# Patient Record
Sex: Female | Born: 1995 | Race: White | Hispanic: No | Marital: Single | Smoking: Former smoker
Health system: Southern US, Community
[De-identification: ages and names within clinical notes are randomized; demographics above are authoritative.]

## PROBLEM LIST (undated history)

## (undated) DIAGNOSIS — Z789 Other specified health status: Secondary | ICD-10-CM

## (undated) HISTORY — PX: ADENOIDECTOMY: SUR15

## (undated) HISTORY — PX: TONSILLECTOMY: SUR1361

## (undated) HISTORY — DX: Other specified health status: Z78.9

---
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
## 2014-07-22 ENCOUNTER — Encounter (HOSPITAL_COMMUNITY): Payer: Self-pay | Admitting: Emergency Medicine

## 2014-07-22 ENCOUNTER — Emergency Department (HOSPITAL_COMMUNITY)
Admission: EM | Admit: 2014-07-22 | Discharge: 2014-07-22 | Disposition: A | Discharge status: Home / Self Care | Payer: BC Managed Care – PPO | Attending: Emergency Medicine | Admitting: Emergency Medicine

## 2014-07-22 DIAGNOSIS — O9989 Other specified diseases and conditions complicating pregnancy, childbirth and the puerperium: Secondary | ICD-10-CM | POA: Insufficient documentation

## 2014-07-22 DIAGNOSIS — IMO0002 Reserved for concepts with insufficient information to code with codable children: Secondary | ICD-10-CM | POA: Insufficient documentation

## 2014-07-22 DIAGNOSIS — O99330 Smoking (tobacco) complicating pregnancy, unspecified trimester: Secondary | ICD-10-CM | POA: Insufficient documentation

## 2014-07-22 DIAGNOSIS — T148XXA Other injury of unspecified body region, initial encounter: Secondary | ICD-10-CM

## 2014-07-22 DIAGNOSIS — O209 Hemorrhage in early pregnancy, unspecified: Secondary | ICD-10-CM | POA: Insufficient documentation

## 2014-07-22 DIAGNOSIS — R296 Repeated falls: Secondary | ICD-10-CM | POA: Insufficient documentation

## 2014-07-22 DIAGNOSIS — N939 Abnormal uterine and vaginal bleeding, unspecified: Secondary | ICD-10-CM

## 2014-07-22 DIAGNOSIS — Y9389 Activity, other specified: Secondary | ICD-10-CM | POA: Insufficient documentation

## 2014-07-22 DIAGNOSIS — Y9289 Other specified places as the place of occurrence of the external cause: Secondary | ICD-10-CM | POA: Insufficient documentation

## 2014-07-22 DIAGNOSIS — R1032 Left lower quadrant pain: Secondary | ICD-10-CM | POA: Insufficient documentation

## 2014-07-22 MED ORDER — BACITRACIN ZINC 500 UNIT/GM EX OINT
1.0000 "application " | TOPICAL_OINTMENT | Freq: Two times a day (BID) | CUTANEOUS | Status: DC
  Administered 2014-07-22: 1 via TOPICAL

## 2014-07-22 MED ORDER — ACETAMINOPHEN 500 MG PO TABS
1000.0000 mg | ORAL_TABLET | Freq: Once | ORAL | Status: AC
Start: 2014-07-22 — End: 2014-07-22
  Administered 2014-07-22: 1000 mg via ORAL
  Filled 2014-07-22: qty 2

## 2014-07-22 NOTE — Discharge Instructions (Signed)
Tylenol for pain antibioitc ointment on your wounds twice daily See your OBGYN at your appointment this week.

## 2014-07-22 NOTE — ED Provider Notes (Signed)
CSN: [id number]     Arrival date & time 07/22/14  2007 History   First MD Initiated Contact with Patient 07/22/14 2206     Chief Complaint  Patient presents with  . Fall     (Consider location/radiation/quality/duration/timing/severity/associated sxs/prior Treatment) HPI Comments: 18 year old female pregnant at approximately 4 weeks by dates, positive pregnancy test at home x4. She presents after having a fall onto her hands and knees, received abrasions of all 4 extremities at the palms and knees. Also went on her abdomen and had a small amount of spotting afterwards. She denies any vaginal discharge, dysuria, fever chills nausea vomiting. She does have mild left lower quadrant abdominal discomfort after the fall. She is G1 P0, has appointment with OB/GYN this week.  Patient is a 18 y.o. female presenting with fall. The history is provided by the patient.  Fall    History reviewed. No pertinent past medical history. Past Surgical History  Procedure Laterality Date  . Tonsillectomy    . Adenoidectomy     Family History  Problem Relation Age of Onset  . Diabetes Father    History  Substance Use Topics  . Smoking status: Current Every Day Smoker  . Smokeless tobacco: Not on file  . Alcohol Use: No   OB History   Grav Para Term Preterm Abortions TAB SAB Ect Mult Living   1              Review of Systems  All other systems reviewed and are negative.     Allergies  Review of patient's allergies indicates no known allergies.  Home Medications   Prior to Admission medications   Not on File   BP 139/85  Pulse 90  Temp(Src) 98.4 F (36.9 C) (Oral)  Resp 16  SpO2 100%  LMP 05/26/2014 Physical Exam  Nursing note and vitals reviewed. Constitutional: She appears well-developed and well-nourished. No distress.  HENT:  Head: Normocephalic and atraumatic.  Mouth/Throat: Oropharynx is clear and moist. No oropharyngeal exudate.  Eyes: Conjunctivae and EOM are normal.  Pupils are equal, round, and reactive to light. Right eye exhibits no discharge. Left eye exhibits no discharge. No scleral icterus.  Neck: Normal range of motion. Neck supple. No JVD present. No thyromegaly present.  Cardiovascular: Normal rate, regular rhythm, normal heart sounds and intact distal pulses.  Exam reveals no gallop and no friction rub.   No murmur heard. Pulmonary/Chest: Effort normal and breath sounds normal. No respiratory distress. She has no wheezes. She has no rales.  Abdominal: Soft. Bowel sounds are normal. She exhibits no distension and no mass. There is tenderness (minimal left lower quadrant tenderness, no rebound guarding or masses).  Musculoskeletal: Normal range of motion. She exhibits no edema and no tenderness.  Lymphadenopathy:    She has no cervical adenopathy.  Neurological: She is alert. Coordination normal.  Skin: Skin is warm and dry. No rash noted. No erythema.  Psychiatric: She has a normal mood and affect. Her behavior is normal.    ED Course  Procedures (including critical care time) Labs Review Labs Reviewed - No data to display  Imaging Review No results found.    MDM   Final diagnoses:  None     Pelvic exam deferred given that the patient has a very early pregnancy which had seen on ultrasound with a small amount of fluid in the uterus. No definite fetus was seen, the patient can followup in the outpatient setting, doubt tubal pregnancy given the pain in  relation to the patient falling prior to arrival. Tylenol given, no other intra-abdominal concerns, very soft abdomen. Patient given Tylenol, instructed to return to OB/GYN at her appointment.  Vida Roller, MD 07/22/14 2227
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
## 2014-08-12 ENCOUNTER — Other Ambulatory Visit: Payer: Self-pay

## 2014-08-19 ENCOUNTER — Other Ambulatory Visit: Payer: Self-pay | Admitting: Obstetrics & Gynecology

## 2014-08-19 DIAGNOSIS — O3680X0 Pregnancy with inconclusive fetal viability, not applicable or unspecified: Secondary | ICD-10-CM

## 2014-08-20 ENCOUNTER — Other Ambulatory Visit: Payer: Self-pay | Admitting: Obstetrics & Gynecology

## 2014-08-20 ENCOUNTER — Ambulatory Visit (INDEPENDENT_AMBULATORY_CARE_PROVIDER_SITE_OTHER): Payer: BC Managed Care – PPO

## 2014-08-20 DIAGNOSIS — O3680X0 Pregnancy with inconclusive fetal viability, not applicable or unspecified: Secondary | ICD-10-CM

## 2014-08-20 DIAGNOSIS — O26849 Uterine size-date discrepancy, unspecified trimester: Secondary | ICD-10-CM

## 2014-08-20 NOTE — Progress Notes (Signed)
U/S-single IUP with +FCA noted, FHR-170 bpm, cx appears closed, bilateral adnexa appears WNL, CRL c/w 9+6wks EDD 03/19/2015

## 2014-08-29 ENCOUNTER — Other Ambulatory Visit: Payer: Self-pay | Admitting: Obstetrics and Gynecology

## 2014-08-29 DIAGNOSIS — Z36 Encounter for antenatal screening of mother: Secondary | ICD-10-CM

## 2014-09-03 ENCOUNTER — Encounter: Payer: Self-pay | Admitting: Women's Health

## 2014-09-03 ENCOUNTER — Ambulatory Visit (INDEPENDENT_AMBULATORY_CARE_PROVIDER_SITE_OTHER): Payer: BC Managed Care – PPO

## 2014-09-03 ENCOUNTER — Ambulatory Visit (INDEPENDENT_AMBULATORY_CARE_PROVIDER_SITE_OTHER): Payer: BC Managed Care – PPO | Admitting: Women's Health

## 2014-09-03 VITALS — BP 104/74 | Ht 63.0 in | Wt 127.0 lb

## 2014-09-03 DIAGNOSIS — Z1371 Encounter for nonprocreative screening for genetic disease carrier status: Secondary | ICD-10-CM

## 2014-09-03 DIAGNOSIS — Z1389 Encounter for screening for other disorder: Secondary | ICD-10-CM

## 2014-09-03 DIAGNOSIS — Z1159 Encounter for screening for other viral diseases: Secondary | ICD-10-CM

## 2014-09-03 DIAGNOSIS — Z0184 Encounter for antibody response examination: Secondary | ICD-10-CM

## 2014-09-03 DIAGNOSIS — Z331 Pregnant state, incidental: Secondary | ICD-10-CM

## 2014-09-03 DIAGNOSIS — Z3400 Encounter for supervision of normal first pregnancy, unspecified trimester: Secondary | ICD-10-CM | POA: Insufficient documentation

## 2014-09-03 DIAGNOSIS — Z0189 Encounter for other specified special examinations: Secondary | ICD-10-CM

## 2014-09-03 DIAGNOSIS — Z3401 Encounter for supervision of normal first pregnancy, first trimester: Secondary | ICD-10-CM

## 2014-09-03 DIAGNOSIS — Z36 Encounter for antenatal screening of mother: Secondary | ICD-10-CM

## 2014-09-03 LAB — POCT URINALYSIS DIPSTICK
Blood, UA: NEGATIVE
Glucose, UA: NEGATIVE
Ketones, UA: NEGATIVE
Nitrite, UA: NEGATIVE
PROTEIN UA: NEGATIVE

## 2014-09-03 NOTE — Patient Instructions (Signed)
First Trimester of Pregnancy The first trimester of pregnancy is from week 1 until the end of week 12 (months 1 through 3). A week after a sperm fertilizes an egg, the egg will implant on the wall of the uterus. This embryo will begin to develop into a baby. Genes from you and your partner are forming the baby. The female genes determine whether the baby is a boy or a girl. At 6-8 weeks, the eyes and face are formed, and the heartbeat can be seen on ultrasound. At the end of 12 weeks, all the baby's organs are formed.  Now that you are pregnant, you will want to do everything you can to have a healthy baby. Two of the most important things are to get good prenatal care and to follow your health care provider's instructions. Prenatal care is all the medical care you receive before the baby's birth. This care will help prevent, find, and treat any problems during the pregnancy and childbirth. BODY CHANGES Your body goes through many changes during pregnancy. The changes vary from woman to woman.   You may gain or lose a couple of pounds at first.  You may feel sick to your stomach (nauseous) and throw up (vomit). If the vomiting is uncontrollable, call your health care provider.  You may tire easily.  You may develop headaches that can be relieved by medicines approved by your health care provider.  You may urinate more often. Painful urination may mean you have a bladder infection.  You may develop heartburn as a result of your pregnancy.  You may develop constipation because certain hormones are causing the muscles that push waste through your intestines to slow down.  You may develop hemorrhoids or swollen, bulging veins (varicose veins).  Your breasts may begin to grow larger and become tender. Your nipples may stick out more, and the tissue that surrounds them (areola) may become darker.  Your gums may bleed and may be sensitive to brushing and flossing.  Dark spots or blotches (chloasma,  mask of pregnancy) may develop on your face. This will likely fade after the baby is born.  Your menstrual periods will stop.  You may have a loss of appetite.  You may develop cravings for certain kinds of food.  You may have changes in your emotions from day to day, such as being excited to be pregnant or being concerned that something may go wrong with the pregnancy and baby.  You may have more vivid and strange dreams.  You may have changes in your hair. These can include thickening of your hair, rapid growth, and changes in texture. Some women also have hair loss during or after pregnancy, or hair that feels dry or thin. Your hair will most likely return to normal after your baby is born. WHAT TO EXPECT AT YOUR PRENATAL VISITS During a routine prenatal visit:  You will be weighed to make sure you and the baby are growing normally.  Your blood pressure will be taken.  Your abdomen will be measured to track your baby's growth.  The fetal heartbeat will be listened to starting around week 10 or 12 of your pregnancy.  Test results from any previous visits will be discussed. Your health care provider may ask you:  How you are feeling.  If you are feeling the baby move.  If you have had any abnormal symptoms, such as leaking fluid, bleeding, severe headaches, or abdominal cramping.  If you have any questions. Other tests   that may be performed during your first trimester include:  Blood tests to find your blood type and to check for the presence of any previous infections. They will also be used to check for low iron levels (anemia) and Rh antibodies. Later in the pregnancy, blood tests for diabetes will be done along with other tests if problems develop.  Urine tests to check for infections, diabetes, or protein in the urine.  An ultrasound to confirm the proper growth and development of the baby.  An amniocentesis to check for possible genetic problems.  Fetal screens for  spina bifida and Down syndrome.  You may need other tests to make sure you and the baby are doing well. HOME CARE INSTRUCTIONS  Medicines  Follow your health care provider's instructions regarding medicine use. Specific medicines may be either safe or unsafe to take during pregnancy.  Take your prenatal vitamins as directed.  If you develop constipation, try taking a stool softener if your health care provider approves. Diet  Eat regular, well-balanced meals. Choose a variety of foods, such as meat or vegetable-based protein, fish, milk and low-fat dairy products, vegetables, fruits, and whole grain breads and cereals. Your health care provider will help you determine the amount of weight gain that is right for you.  Avoid raw meat and uncooked cheese. These carry germs that can cause birth defects in the baby.  Eating four or five small meals rather than three large meals a day may help relieve nausea and vomiting. If you start to feel nauseous, eating a few soda crackers can be helpful. Drinking liquids between meals instead of during meals also seems to help nausea and vomiting.  If you develop constipation, eat more high-fiber foods, such as fresh vegetables or fruit and whole grains. Drink enough fluids to keep your urine clear or pale yellow. Activity and Exercise  Exercise only as directed by your health care provider. Exercising will help you:  Control your weight.  Stay in shape.  Be prepared for labor and delivery.  Experiencing pain or cramping in the lower abdomen or low back is a good sign that you should stop exercising. Check with your health care provider before continuing normal exercises.  Try to avoid standing for long periods of time. Move your legs often if you must stand in one place for a long time.  Avoid heavy lifting.  Wear low-heeled shoes, and practice good posture.  You may continue to have sex unless your health care provider directs you  otherwise. Relief of Pain or Discomfort  Wear a good support bra for breast tenderness.   Take warm sitz baths to soothe any pain or discomfort caused by hemorrhoids. Use hemorrhoid cream if your health care provider approves.   Rest with your legs elevated if you have leg cramps or low back pain.  If you develop varicose veins in your legs, wear support hose. Elevate your feet for 15 minutes, 3-4 times a day. Limit salt in your diet. Prenatal Care  Schedule your prenatal visits by the twelfth week of pregnancy. They are usually scheduled monthly at first, then more often in the last 2 months before delivery.  Write down your questions. Take them to your prenatal visits.  Keep all your prenatal visits as directed by your health care provider. Safety  Wear your seat belt at all times when driving.  Make a list of emergency phone numbers, including numbers for family, friends, the hospital, and police and fire departments. General Tips    Ask your health care provider for a referral to a local prenatal education class. Begin classes no later than at the beginning of month 6 of your pregnancy.  Ask for help if you have counseling or nutritional needs during pregnancy. Your health care provider can offer advice or refer you to specialists for help with various needs.  Do not use hot tubs, steam rooms, or saunas.  Do not douche or use tampons or scented sanitary pads.  Do not cross your legs for long periods of time.  Avoid cat litter boxes and soil used by cats. These carry germs that can cause birth defects in the baby and possibly loss of the fetus by miscarriage or stillbirth.  Avoid all smoking, herbs, alcohol, and medicines not prescribed by your health care provider. Chemicals in these affect the formation and growth of the baby.  Schedule a dentist appointment. At home, brush your teeth with a soft toothbrush and be gentle when you floss. SEEK MEDICAL CARE IF:   You have  dizziness.  You have mild pelvic cramps, pelvic pressure, or nagging pain in the abdominal area.  You have persistent nausea, vomiting, or diarrhea.  You have a bad smelling vaginal discharge.  You have pain with urination.  You notice increased swelling in your face, hands, legs, or ankles. SEEK IMMEDIATE MEDICAL CARE IF:   You have a fever.  You are leaking fluid from your vagina.  You have spotting or bleeding from your vagina.  You have severe abdominal cramping or pain.  You have rapid weight gain or loss.  You vomit blood or material that looks like coffee grounds.  You are exposed to German measles and have never had them.  You are exposed to fifth disease or chickenpox.  You develop a severe headache.  You have shortness of breath.  You have any kind of trauma, such as from a fall or a car accident. Document Released: 11/30/2001 Document Revised: 04/22/2014 Document Reviewed: 10/16/2013 Exit[employer] Patient Information 2015 [employer]. This information is not intended to replace advice given to you by your health care provider. Make sure you discuss any questions you have with your health care provider.  

## 2014-09-03 NOTE — Progress Notes (Signed)
  Subjective:  Diane Doyle is a 18 y.o. G1P0 Caucasian female at [redacted]w[redacted]d by 9wk u/s, being seen today for her first obstetrical visit.  Her obstetrical history is significant for previous smoker- quit w/ + PT, primigravida.  Pregnancy history fully reviewed.  Patient reports no complaints. Denies vb, cramping, uti s/s, abnormal/malodorous vag d/c, or vulvovaginal itching/irritation.  BP 104/74  Wt 127 lb (57.607 kg)  LMP 05/26/2014  HISTORY: OB History  Gravida Para Term Preterm AB SAB TAB Ectopic Multiple Living  1             # Outcome Date GA Lbr Len/2nd Weight Sex Delivery Anes PTL Lv  1 CUR              Past Medical History  Diagnosis Date  . Medical history non-contributory    Past Surgical History  Procedure Laterality Date  . Tonsillectomy    . Adenoidectomy     Family History  Problem Relation Age of Onset  . Diabetes Father     Exam   System:     General: Well developed & nourished, no acute distress   Skin: Warm & dry, normal coloration and turgor, no rashes   Neurologic: Alert & oriented, normal mood   Cardiovascular: Regular rate & rhythm   Respiratory: Effort & rate normal, LCTAB, acyanotic   Abdomen: Soft, non tender   Extremities: normal strength, tone   Thin prep pap smear n/a <21yo FHR: 161 via u/s   Assessment:   Pregnancy: G1P0 Patient Active Problem List   Diagnosis Date Noted  . Supervision of normal first pregnancy 09/03/2014    Priority: High    [redacted]w[redacted]d G1P0 New OB visit    Plan:  Initial labs drawn Continue prenatal vitamins Problem list reviewed and updated Reviewed n/v relief measures and warning s/s to report Reviewed recommended weight gain based on pre-gravid BMI Encouraged well-balanced diet Genetic Screening discussed Integrated Screen: requested, had 1st IT/NT today Cystic fibrosis screening discussed requested Ultrasound discussed; fetal survey: requested Follow up in 4 weeks for visit and 2nd IT CCNC  completed NFPartnership offered, accepted, referral faxed   Marge Duncans CNM, [facility] 09/03/2014 3:58 PM

## 2014-09-03 NOTE — Progress Notes (Signed)
U/S(11+6wks)-single active fetus, CRL c/w dates, cx appears closed, retroverted uterus, FHR- 161 bpm, anterior Gr 0 placenta, bilateral adnexa appears WNL, NB present, NT-1.71mm

## 2014-09-04 ENCOUNTER — Encounter: Payer: Self-pay | Admitting: Women's Health

## 2014-09-04 DIAGNOSIS — O26899 Other specified pregnancy related conditions, unspecified trimester: Secondary | ICD-10-CM

## 2014-09-04 DIAGNOSIS — Z6791 Unspecified blood type, Rh negative: Secondary | ICD-10-CM | POA: Insufficient documentation

## 2014-09-04 LAB — CBC
HCT: 40.7 % (ref 36.0–46.0)
Hemoglobin: 14 g/dL (ref 12.0–15.0)
MCH: 32.5 pg (ref 26.0–34.0)
MCHC: 34.4 g/dL (ref 30.0–36.0)
MCV: 94.4 fL (ref 78.0–100.0)
Platelets: 236 10*3/uL (ref 150–400)
RBC: 4.31 MIL/uL (ref 3.87–5.11)
RDW: 11.8 % (ref 11.5–15.5)
WBC: 11.2 10*3/uL — ABNORMAL HIGH (ref 4.0–10.5)

## 2014-09-04 LAB — HEPATITIS B SURFACE ANTIGEN: Hepatitis B Surface Ag: NEGATIVE

## 2014-09-04 LAB — ABO AND RH: Rh Type: NEGATIVE

## 2014-09-04 LAB — VARICELLA ZOSTER ANTIBODY, IGG: VARICELLA IGG: 872.1 {index} — AB (ref <135.00)

## 2014-09-04 LAB — RUBELLA SCREEN: Rubella: 1.62 Index — ABNORMAL HIGH (ref <0.90)

## 2014-09-04 LAB — CYSTIC FIBROSIS DIAGNOSTIC STUDY

## 2014-09-04 LAB — HIV ANTIBODY (ROUTINE TESTING W REFLEX): HIV 1&2 Ab, 4th Generation: NONREACTIVE

## 2014-09-04 LAB — RPR

## 2014-09-04 LAB — ANTIBODY SCREEN: Antibody Screen: NEGATIVE

## 2014-09-06 ENCOUNTER — Other Ambulatory Visit: Payer: BC Managed Care – PPO

## 2014-09-06 LAB — MATERNAL SCREEN, INTEGRATED #1

## 2014-09-07 LAB — URINALYSIS, MICROSCOPIC ONLY
Bacteria, UA: NONE SEEN
CASTS: NONE SEEN
Crystals: NONE SEEN

## 2014-09-07 LAB — DRUG SCREEN, URINE, NO CONFIRMATION
Amphetamine Screen, Ur: NEGATIVE
BENZODIAZEPINES.: NEGATIVE
Barbiturate Quant, Ur: NEGATIVE
COCAINE METABOLITES: NEGATIVE
CREATININE, U: 95.1 mg/dL
MARIJUANA METABOLITE: POSITIVE — AB
Methadone: NEGATIVE
OPIATE SCREEN, URINE: NEGATIVE
Phencyclidine (PCP): NEGATIVE
Propoxyphene: NEGATIVE

## 2014-09-07 LAB — GC/CHLAMYDIA PROBE AMP
CT Probe RNA: NEGATIVE
GC Probe RNA: NEGATIVE

## 2014-09-07 LAB — URINALYSIS, ROUTINE W REFLEX MICROSCOPIC
BILIRUBIN URINE: NEGATIVE
GLUCOSE, UA: NEGATIVE mg/dL
Hgb urine dipstick: NEGATIVE
Ketones, ur: NEGATIVE mg/dL
Nitrite: NEGATIVE
PROTEIN: NEGATIVE mg/dL
SPECIFIC GRAVITY, URINE: 1.011 (ref 1.005–1.030)
UROBILINOGEN UA: 0.2 mg/dL (ref 0.0–1.0)
pH: 7 (ref 5.0–8.0)

## 2014-09-07 LAB — OXYCODONE SCREEN, UA, RFLX CONFIRM: Oxycodone Screen, Ur: NEGATIVE ng/mL

## 2014-09-08 LAB — URINE CULTURE
COLONY COUNT: NO GROWTH
Organism ID, Bacteria: NO GROWTH

## 2014-09-09 ENCOUNTER — Encounter: Payer: Self-pay | Admitting: Women's Health

## 2014-09-09 DIAGNOSIS — F1290 Cannabis use, unspecified, uncomplicated: Secondary | ICD-10-CM | POA: Insufficient documentation

## 2014-10-04 ENCOUNTER — Encounter: Payer: Self-pay | Admitting: Adult Health

## 2014-10-04 ENCOUNTER — Ambulatory Visit (INDEPENDENT_AMBULATORY_CARE_PROVIDER_SITE_OTHER): Payer: BC Managed Care – PPO | Admitting: Adult Health

## 2014-10-04 VITALS — BP 112/66 | Wt 133.0 lb

## 2014-10-04 DIAGNOSIS — Z331 Pregnant state, incidental: Secondary | ICD-10-CM

## 2014-10-04 DIAGNOSIS — Z1389 Encounter for screening for other disorder: Secondary | ICD-10-CM

## 2014-10-04 DIAGNOSIS — Z3492 Encounter for supervision of normal pregnancy, unspecified, second trimester: Secondary | ICD-10-CM

## 2014-10-04 DIAGNOSIS — Z3682 Encounter for antenatal screening for nuchal translucency: Secondary | ICD-10-CM

## 2014-10-04 LAB — POCT URINALYSIS DIPSTICK
Glucose, UA: NEGATIVE
KETONES UA: NEGATIVE
Nitrite, UA: NEGATIVE
PROTEIN UA: NEGATIVE
RBC UA: NEGATIVE

## 2014-10-04 NOTE — Progress Notes (Signed)
G1P0 6944w2d Estimated Date of Delivery: 03/19/15  Blood pressure 112/66, weight 133 lb (60.328 kg), last menstrual period 05/26/2014.   BP weight and urine results all reviewed and noted.  Please refer to the obstetrical flow sheet for the fundal height and fetal heart rate documentation:FHR 152  Patient denies any bleeding and no rupture of membranes symptoms or regular contractions. Patient is without complaints. All questions were answered.  Plan:  Continued routine obstetrical care, Second IT today  Follow up in 4 weeks for OB appointment, anatomy scan and see me

## 2014-10-04 NOTE — Patient Instructions (Signed)
Second Trimester of Pregnancy The second trimester is from week 13 through week 28, months 4 through 6. The second trimester is often a time when you feel your best. Your body has also adjusted to being pregnant, and you begin to feel better physically. Usually, morning sickness has lessened or quit completely, you may have more energy, and you may have an increase in appetite. The second trimester is also a time when the fetus is growing rapidly. At the end of the sixth month, the fetus is about 9 inches long and weighs about 1 pounds. You will likely begin to feel the baby move (quickening) between 18 and 20 weeks of the pregnancy. BODY CHANGES Your body goes through many changes during pregnancy. The changes vary from woman to woman.   Your weight will continue to increase. You will notice your lower abdomen bulging out.  You may begin to get stretch marks on your hips, abdomen, and breasts.  You may develop headaches that can be relieved by medicines approved by your health care provider.  You may urinate more often because the fetus is pressing on your bladder.  You may develop or continue to have heartburn as a result of your pregnancy.  You may develop constipation because certain hormones are causing the muscles that push waste through your intestines to slow down.  You may develop hemorrhoids or swollen, bulging veins (varicose veins).  You may have back pain because of the weight gain and pregnancy hormones relaxing your joints between the bones in your pelvis and as a result of a shift in weight and the muscles that support your balance.  Your breasts will continue to grow and be tender.  Your gums may bleed and may be sensitive to brushing and flossing.  Dark spots or blotches (chloasma, mask of pregnancy) may develop on your face. This will likely fade after the baby is born.  A dark line from your belly button to the pubic area (linea nigra) may appear. This will likely fade  after the baby is born.  You may have changes in your hair. These can include thickening of your hair, rapid growth, and changes in texture. Some women also have hair loss during or after pregnancy, or hair that feels dry or thin. Your hair will most likely return to normal after your baby is born. WHAT TO EXPECT AT YOUR PRENATAL VISITS During a routine prenatal visit:  You will be weighed to make sure you and the fetus are growing normally.  Your blood pressure will be taken.  Your abdomen will be measured to track your baby's growth.  The fetal heartbeat will be listened to.  Any test results from the previous visit will be discussed. Your health care provider may ask you:  How you are feeling.  If you are feeling the baby move.  If you have had any abnormal symptoms, such as leaking fluid, bleeding, severe headaches, or abdominal cramping.  If you have any questions. Other tests that may be performed during your second trimester include:  Blood tests that check for:  Low iron levels (anemia).  Gestational diabetes (between 24 and 28 weeks).  Rh antibodies.  Urine tests to check for infections, diabetes, or protein in the urine.  An ultrasound to confirm the proper growth and development of the baby.  An amniocentesis to check for possible genetic problems.  Fetal screens for spina bifida and Down syndrome. HOME CARE INSTRUCTIONS   Avoid all smoking, herbs, alcohol, and unprescribed   drugs. These chemicals affect the formation and growth of the baby.  Follow your health care provider's instructions regarding medicine use. There are medicines that are either safe or unsafe to take during pregnancy.  Exercise only as directed by your health care provider. Experiencing uterine cramps is a good sign to stop exercising.  Continue to eat regular, healthy meals.  Wear a good support bra for breast tenderness.  Do not use hot tubs, steam rooms, or saunas.  Wear your  seat belt at all times when driving.  Avoid raw meat, uncooked cheese, cat litter boxes, and soil used by cats. These carry germs that can cause birth defects in the baby.  Take your prenatal vitamins.  Try taking a stool softener (if your health care provider approves) if you develop constipation. Eat more high-fiber foods, such as fresh vegetables or fruit and whole grains. Drink plenty of fluids to keep your urine clear or pale yellow.  Take warm sitz baths to soothe any pain or discomfort caused by hemorrhoids. Use hemorrhoid cream if your health care provider approves.  If you develop varicose veins, wear support hose. Elevate your feet for 15 minutes, 3-4 times a day. Limit salt in your diet.  Avoid heavy lifting, wear low heel shoes, and practice good posture.  Rest with your legs elevated if you have leg cramps or low back pain.  Visit your dentist if you have not gone yet during your pregnancy. Use a soft toothbrush to brush your teeth and be gentle when you floss.  A sexual relationship may be continued unless your health care provider directs you otherwise.  Continue to go to all your prenatal visits as directed by your health care provider. SEEK MEDICAL CARE IF:   You have dizziness.  You have mild pelvic cramps, pelvic pressure, or nagging pain in the abdominal area.  You have persistent nausea, vomiting, or diarrhea.  You have a bad smelling vaginal discharge.  You have pain with urination. SEEK IMMEDIATE MEDICAL CARE IF:   You have a fever.  You are leaking fluid from your vagina.  You have spotting or bleeding from your vagina.  You have severe abdominal cramping or pain.  You have rapid weight gain or loss.  You have shortness of breath with chest pain.  You notice sudden or extreme swelling of your face, hands, ankles, feet, or legs.  You have not felt your baby move in over an hour.  You have severe headaches that do not go away with  medicine.  You have vision changes. Document Released: 11/30/2001 Document Revised: 12/11/2013 Document Reviewed: 02/06/2013 [facility] Patient Information 2015 [employer], [place]. This information is not intended to replace advice given to you by your health care provider. Make sure you discuss any questions you have with your health care provider. Follow in 4 weeks for [place]

## 2014-10-09 LAB — MATERNAL SCREEN, INTEGRATED #2
AFP MOM MAT SCREEN: 1.05
AFP, SERUM MAT SCREEN: 40 ng/mL
Age risk Down Syndrome: 1:1100 {titer}
CALCULATED GESTATIONAL AGE MAT SCREEN: 16.6
CROWN RUMP LENGTH MAT SCREEN 2: 57.6 mm
ESTRIOL MOM MAT SCREEN: 1.24
Estriol, Free: 1.25 ng/mL
HCG, SERUM MAT SCREEN: 105.3 [IU]/mL
Inhibin A Dimeric: 241 pg/mL
Inhibin A MoM: 1.36
NT MOM MAT SCREEN: 1.05
NUCHAL TRANSLUCENCY MAT SCREEN 2: 1.41 mm
NUMBER OF FETUSES MAT SCREEN 2: 1
PAPP-A MAT SCREEN: 558 ng/mL
PAPP-A MOM MAT SCREEN: 0.83
Rish for ONTD: <1:5000 {titer}
hCG MoM: 3.15

## 2014-10-10 ENCOUNTER — Encounter: Payer: Self-pay | Admitting: Adult Health

## 2014-10-21 ENCOUNTER — Encounter: Payer: Self-pay | Admitting: Adult Health

## 2014-11-01 ENCOUNTER — Encounter: Payer: Self-pay | Admitting: Adult Health

## 2014-11-01 ENCOUNTER — Ambulatory Visit (INDEPENDENT_AMBULATORY_CARE_PROVIDER_SITE_OTHER): Payer: BC Managed Care – PPO | Admitting: Adult Health

## 2014-11-01 ENCOUNTER — Ambulatory Visit (INDEPENDENT_AMBULATORY_CARE_PROVIDER_SITE_OTHER): Payer: BC Managed Care – PPO

## 2014-11-01 ENCOUNTER — Other Ambulatory Visit: Payer: Self-pay | Admitting: Adult Health

## 2014-11-01 VITALS — BP 112/70 | Wt 135.0 lb

## 2014-11-01 DIAGNOSIS — Z3689 Encounter for other specified antenatal screening: Secondary | ICD-10-CM

## 2014-11-01 DIAGNOSIS — F1910 Other psychoactive substance abuse, uncomplicated: Secondary | ICD-10-CM

## 2014-11-01 DIAGNOSIS — Z36 Encounter for antenatal screening of mother: Secondary | ICD-10-CM

## 2014-11-01 DIAGNOSIS — Z3492 Encounter for supervision of normal pregnancy, unspecified, second trimester: Secondary | ICD-10-CM

## 2014-11-01 DIAGNOSIS — O99320 Drug use complicating pregnancy, unspecified trimester: Secondary | ICD-10-CM

## 2014-11-01 DIAGNOSIS — Z1389 Encounter for screening for other disorder: Secondary | ICD-10-CM

## 2014-11-01 DIAGNOSIS — O09899 Supervision of other high risk pregnancies, unspecified trimester: Secondary | ICD-10-CM

## 2014-11-01 DIAGNOSIS — Z3400 Encounter for supervision of normal first pregnancy, unspecified trimester: Secondary | ICD-10-CM

## 2014-11-01 DIAGNOSIS — Z331 Pregnant state, incidental: Secondary | ICD-10-CM

## 2014-11-01 LAB — POCT URINALYSIS DIPSTICK
Glucose, UA: NEGATIVE
Ketones, UA: NEGATIVE
Leukocytes, UA: NEGATIVE
NITRITE UA: NEGATIVE
Protein, UA: NEGATIVE

## 2014-11-01 NOTE — Progress Notes (Signed)
G1P0 8469w2d Estimated Date of Delivery: 03/19/15  Blood pressure 112/70, weight 135 lb (61.236 kg), last menstrual period 05/26/2014.   BP weight and urine results all reviewed and noted.  Please refer to the obstetrical flow sheet for the fundal height and fetal heart rate documentation: FHR 157 on US  Patient reports butterflies in stomach, denies any bleeding and no rupture of membranes symptoms or regular contractions. Patient is without complaints. All questions were answered. Pt aware that will need follow up US.  Plan:  Continued routine obstetrical care,  Follow up in 4 weeks for OB appointment,

## 2014-11-01 NOTE — Patient Instructions (Signed)
Return in 4 weeks to see Dr Despina HiddenEure Fetal Movement Counts Patient Name: __________________________________________________ Patient Due Date: ____________________ Performing a fetal movement count is highly recommended in high-risk pregnancies, but it is good for every pregnant woman to do. Your health care provider may ask you to start counting fetal movements at 28 weeks of the pregnancy. Fetal movements often increase:  After eating a full meal.  After physical activity.  After eating or drinking something sweet or cold.  At rest. Pay attention to when you feel the baby is most active. This will help you notice a pattern of your baby's sleep and wake cycles and what factors contribute to an increase in fetal movement. It is important to perform a fetal movement count at the same time each day when your baby is normally most active.  HOW TO COUNT FETAL MOVEMENTS 1. Find a quiet and comfortable area to sit or lie down on your left side. Lying on your left side provides the best blood and oxygen circulation to your baby. 2. Write down the day and time on a sheet of paper or in a journal. 3. Start counting kicks, flutters, swishes, rolls, or jabs in a 2-hour period. You should feel at least 10 movements within 2 hours. 4. If you do not feel 10 movements in 2 hours, wait 2-3 hours and count again. Look for a change in the pattern or not enough counts in 2 hours. SEEK MEDICAL CARE IF:  You feel less than 10 counts in 2 hours, tried twice.  There is no movement in over an hour.  The pattern is changing or taking longer each day to reach 10 counts in 2 hours.  You feel the baby is not moving as he or she usually does. Date: ____________ Movements: ____________ Start time: ____________ Doreatha MartinFinish time: ____________  Date: ____________ Movements: ____________ Start time: ____________ Doreatha MartinFinish time: ____________ Date: ____________ Movements: ____________ Start time: ____________ Doreatha MartinFinish time:  ____________ Date: ____________ Movements: ____________ Start time: ____________ Doreatha MartinFinish time: ____________ Date: ____________ Movements: ____________ Start time: ____________ Doreatha MartinFinish time: ____________ Date: ____________ Movements: ____________ Start time: ____________ Doreatha MartinFinish time: ____________ Date: ____________ Movements: ____________ Start time: ____________ Doreatha MartinFinish time: ____________ Date: ____________ Movements: ____________ Start time: ____________ Doreatha MartinFinish time: ____________  Date: ____________ Movements: ____________ Start time: ____________ Doreatha MartinFinish time: ____________ Date: ____________ Movements: ____________ Start time: ____________ Doreatha MartinFinish time: ____________ Date: ____________ Movements: ____________ Start time: ____________ Doreatha MartinFinish time: ____________ Date: ____________ Movements: ____________ Start time: ____________ Doreatha MartinFinish time: ____________ Date: ____________ Movements: ____________ Start time: ____________ Doreatha MartinFinish time: ____________ Date: ____________ Movements: ____________ Start time: ____________ Doreatha MartinFinish time: ____________ Date: ____________ Movements: ____________ Start time: ____________ Doreatha MartinFinish time: ____________  Date: ____________ Movements: ____________ Start time: ____________ Doreatha MartinFinish time: ____________ Date: ____________ Movements: ____________ Start time: ____________ Doreatha MartinFinish time: ____________ Date: ____________ Movements: ____________ Start time: ____________ Doreatha MartinFinish time: ____________ Date: ____________ Movements: ____________ Start time: ____________ Doreatha MartinFinish time: ____________ Date: ____________ Movements: ____________ Start time: ____________ Doreatha MartinFinish time: ____________ Date: ____________ Movements: ____________ Start time: ____________ Doreatha MartinFinish time: ____________ Date: ____________ Movements: ____________ Start time: ____________ Doreatha MartinFinish time: ____________  Date: ____________ Movements: ____________ Start time: ____________ Doreatha MartinFinish time: ____________ Date: ____________ Movements:  ____________ Start time: ____________ Doreatha MartinFinish time: ____________ Date: ____________ Movements: ____________ Start time: ____________ Doreatha MartinFinish time: ____________ Date: ____________ Movements: ____________ Start time: ____________ Doreatha MartinFinish time: ____________ Date: ____________ Movements: ____________ Start time: ____________ Doreatha MartinFinish time: ____________ Date: ____________ Movements: ____________ Start time: ____________ Doreatha MartinFinish time: ____________ Date: ____________ Movements: ____________ Start time: ____________ Doreatha MartinFinish time: ____________  Date: ____________ Movements: ____________ Start time: ____________ Doreatha MartinFinish time: ____________ Date: ____________ Movements: ____________ Start time: ____________ Doreatha MartinFinish time: ____________ Date: ____________ Movements: ____________ Start time: ____________ Doreatha MartinFinish time: ____________ Date: ____________ Movements: ____________ Start time: ____________ Doreatha MartinFinish time: ____________ Date: ____________ Movements: ____________ Start time: ____________ Doreatha MartinFinish time: ____________ Date: ____________ Movements: ____________ Start time: ____________ Doreatha MartinFinish time: ____________ Date: ____________ Movements: ____________ Start time: ____________ Doreatha MartinFinish time: ____________  Date: ____________ Movements: ____________ Start time: ____________ Doreatha MartinFinish time: ____________ Date: ____________ Movements: ____________ Start time: ____________ Doreatha MartinFinish time: ____________ Date: ____________ Movements: ____________ Start time: ____________ Doreatha MartinFinish time: ____________ Date: ____________ Movements: ____________ Start time: ____________ Doreatha MartinFinish time: ____________ Date: ____________ Movements: ____________ Start time: ____________ Doreatha MartinFinish time: ____________ Date: ____________ Movements: ____________ Start time: ____________ Doreatha MartinFinish time: ____________ Date: ____________ Movements: ____________ Start time: ____________ Doreatha MartinFinish time: ____________  Date: ____________ Movements: ____________ Start time: ____________ Doreatha MartinFinish  time: ____________ Date: ____________ Movements: ____________ Start time: ____________ Doreatha MartinFinish time: ____________ Date: ____________ Movements: ____________ Start time: ____________ Doreatha MartinFinish time: ____________ Date: ____________ Movements: ____________ Start time: ____________ Doreatha MartinFinish time: ____________ Date: ____________ Movements: ____________ Start time: ____________ Doreatha MartinFinish time: ____________ Date: ____________ Movements: ____________ Start time: ____________ Doreatha MartinFinish time: ____________ Date: ____________ Movements: ____________ Start time: ____________ Doreatha MartinFinish time: ____________  Date: ____________ Movements: ____________ Start time: ____________ Doreatha MartinFinish time: ____________ Date: ____________ Movements: ____________ Start time: ____________ Doreatha MartinFinish time: ____________ Date: ____________ Movements: ____________ Start time: ____________ Doreatha MartinFinish time: ____________ Date: ____________ Movements: ____________ Start time: ____________ Doreatha MartinFinish time: ____________ Date: ____________ Movements: ____________ Start time: ____________ Doreatha MartinFinish time: ____________ Date: ____________ Movements: ____________ Start time: ____________ Doreatha MartinFinish time: ____________ Document Released: 01/05/2007 Document Revised: 04/22/2014 Document Reviewed: 10/02/2012 [employer] Patient Information 2015 [employer]. This information is not intended to replace advice given to you by your health care provider. Make sure you discuss any questions you have with your health care provider.

## 2014-11-01 NOTE — Progress Notes (Signed)
U/S(20+2wks)-vtx active fetus, meas c/w dates, fluid WNL, anterior Gr 0 placenta, cx appears closed (3.8cm), bilateral adnexa appears WNL, female fetus, Cisterna Magna appears "Prominent" although measures <4810mm (=8.550mm), would like to reck at ~26 weeks, fetal screen completed no other major abnl noted

## 2014-11-27 ENCOUNTER — Telehealth: Payer: Self-pay | Admitting: *Deleted

## 2014-11-27 NOTE — Telephone Encounter (Signed)
[facility], [facility] requested pt EDD of 03/19/2015 and [facility].

## 2014-11-29 ENCOUNTER — Encounter: Payer: BC Managed Care – PPO | Admitting: Obstetrics & Gynecology

## 2014-12-18 ENCOUNTER — Encounter: Payer: Self-pay | Admitting: Obstetrics & Gynecology

## 2014-12-18 ENCOUNTER — Ambulatory Visit (INDEPENDENT_AMBULATORY_CARE_PROVIDER_SITE_OTHER): Payer: BC Managed Care – PPO | Admitting: Obstetrics & Gynecology

## 2014-12-18 VITALS — BP 110/70 | Wt 144.4 lb

## 2014-12-18 DIAGNOSIS — Z1389 Encounter for screening for other disorder: Secondary | ICD-10-CM

## 2014-12-18 DIAGNOSIS — Z3492 Encounter for supervision of normal pregnancy, unspecified, second trimester: Secondary | ICD-10-CM

## 2014-12-18 DIAGNOSIS — Z331 Pregnant state, incidental: Secondary | ICD-10-CM

## 2014-12-18 NOTE — Progress Notes (Signed)
[place] needs reschedule for 2 hour GTT and sonogram for cisterna magna evaluation  G1P0 3963w0d Estimated Date of Delivery: 03/19/15  Blood pressure 110/70, weight 144 lb 6.4 oz (65.499 kg), last menstrual period 05/26/2014.   BP weight and urine results all reviewed and noted.  Please refer to the obstetrical flow sheet for the fundal height and fetal heart rate documentation:  Patient reports good fetal movement, denies any bleeding and no rupture of membranes symptoms or regular contractions. Patient is without complaints. All questions were answered.  Plan:  Continued routine obstetrical care,   Follow up in 1 weeks for OB appointment, PN2 + sonogram

## 2014-12-20 NOTE — L&D Delivery Note (Signed)
Delivery Note At 9:15 PM a viable and healthy female was delivered via Vaginal, Spontaneous Delivery (Presentation: ; Occiput Anterior).  APGAR: 9, 9; weight  .   Placenta status: Intact, Manual removal.  Cord: 3 vessels with the following complications: None.    Anesthesia: Epidural  Episiotomy: None Lacerations: 3rd degree Suture Repair: 3.0 monocryl Est. Blood Loss (mL):  800mL  Mom to postpartum.  Baby to Couplet care / Skin to Skin. Placenta to pathology.  Viable female infant delivered with partial third degree laceration. Cord was partially avulsed with traction and no further traction was applied after that. The placenta partially detached approximately 20 minutes after delivery but could not be delivered the rest of the way. Dr. Adrian BlackwaterStinson was called to assist and performed manual extraction of the placenta which appeared intact. He swept the uterus and did not encounter any retained products/membranes. He then repaired the perineal laceration in the usual manner.  Diane Doyle, Diane Doyle 03/25/2015, 10:18 PM

## 2014-12-27 ENCOUNTER — Ambulatory Visit (INDEPENDENT_AMBULATORY_CARE_PROVIDER_SITE_OTHER): Payer: BLUE CROSS/BLUE SHIELD

## 2014-12-27 ENCOUNTER — Ambulatory Visit (INDEPENDENT_AMBULATORY_CARE_PROVIDER_SITE_OTHER): Payer: BLUE CROSS/BLUE SHIELD | Admitting: Obstetrics & Gynecology

## 2014-12-27 ENCOUNTER — Encounter: Payer: Self-pay | Admitting: Obstetrics & Gynecology

## 2014-12-27 ENCOUNTER — Other Ambulatory Visit: Payer: BLUE CROSS/BLUE SHIELD

## 2014-12-27 ENCOUNTER — Other Ambulatory Visit: Payer: Self-pay | Admitting: Obstetrics & Gynecology

## 2014-12-27 VITALS — BP 120/80 | Wt 144.0 lb

## 2014-12-27 DIAGNOSIS — Z114 Encounter for screening for human immunodeficiency virus [HIV]: Secondary | ICD-10-CM

## 2014-12-27 DIAGNOSIS — Z113 Encounter for screening for infections with a predominantly sexual mode of transmission: Secondary | ICD-10-CM

## 2014-12-27 DIAGNOSIS — Z3403 Encounter for supervision of normal first pregnancy, third trimester: Secondary | ICD-10-CM

## 2014-12-27 DIAGNOSIS — Z131 Encounter for screening for diabetes mellitus: Secondary | ICD-10-CM

## 2014-12-27 DIAGNOSIS — Z331 Pregnant state, incidental: Secondary | ICD-10-CM

## 2014-12-27 DIAGNOSIS — Z1389 Encounter for screening for other disorder: Secondary | ICD-10-CM

## 2014-12-27 DIAGNOSIS — O359XX1 Maternal care for (suspected) fetal abnormality and damage, unspecified, fetus 1: Secondary | ICD-10-CM

## 2014-12-27 DIAGNOSIS — Z3492 Encounter for supervision of normal pregnancy, unspecified, second trimester: Secondary | ICD-10-CM

## 2014-12-27 DIAGNOSIS — Z3493 Encounter for supervision of normal pregnancy, unspecified, third trimester: Secondary | ICD-10-CM

## 2014-12-27 DIAGNOSIS — Z0184 Encounter for antibody response examination: Secondary | ICD-10-CM

## 2014-12-27 LAB — CBC
HCT: 35.3 % — ABNORMAL LOW (ref 36.0–46.0)
Hemoglobin: 12.1 g/dL (ref 12.0–15.0)
MCH: 30.9 pg (ref 26.0–34.0)
MCHC: 34.3 g/dL (ref 30.0–36.0)
MCV: 90.1 fL (ref 78.0–100.0)
MPV: 8.7 fL (ref 8.6–12.4)
Platelets: 249 10*3/uL (ref 150–400)
RBC: 3.92 MIL/uL (ref 3.87–5.11)
RDW: 13 % (ref 11.5–15.5)
WBC: 10.6 10*3/uL — ABNORMAL HIGH (ref 4.0–10.5)

## 2014-12-27 LAB — POCT URINALYSIS DIPSTICK
Blood, UA: NEGATIVE
GLUCOSE UA: NEGATIVE
KETONES UA: NEGATIVE
NITRITE UA: NEGATIVE
PROTEIN UA: NEGATIVE

## 2014-12-27 NOTE — Progress Notes (Addendum)
U/S(28+2wks)-vtx active fetus, EFW 2 lb 10 oz (51st%tile), fluid WNL, anterior Gr 2 placenta, FHR- 144 bpm, female fetus, posterior fossa noted again on today's exam, CM remains prominent although no obvious abnl noted

## 2014-12-27 NOTE — Progress Notes (Signed)
Sonogram: prominent cisterna magna but no associated hydrocephly, especially of the fourth ventricle and cerebellum is normal recommend repeat in 4 weeks, probably will need post natal evaluation, possibly a downstream narrowing into the spinal CSF(Forzano et al, Haimovici et al)  G1P0 677w2d Estimated Date of Delivery: 03/19/15  Blood pressure 120/80, weight 144 lb (65.318 kg), last menstrual period 05/26/2014.   BP weight and urine results all reviewed and noted.  Please refer to the obstetrical flow sheet for the fundal height and fetal heart rate documentation:  Patient reports good fetal movement, denies any bleeding and no rupture of membranes symptoms or regular contractions. Patient is without complaints. All questions were answered.  Plan:  Continued routine obstetrical care,   Follow up in 3 weeks for OB appointment, routine

## 2014-12-28 LAB — GLUCOSE TOLERANCE, 2 HOURS W/ 1HR
GLUCOSE: 153 mg/dL (ref 70–170)
Glucose, 2 hour: 82 mg/dL (ref 70–139)
Glucose, Fasting: 90 mg/dL (ref 70–99)

## 2014-12-28 LAB — ANTIBODY SCREEN: ANTIBODY SCREEN: NEGATIVE

## 2014-12-28 LAB — RPR

## 2014-12-28 LAB — HIV ANTIBODY (ROUTINE TESTING W REFLEX): HIV 1&2 Ab, 4th Generation: NONREACTIVE

## 2014-12-30 LAB — HSV 2 ANTIBODY, IGG: HSV 2 GLYCOPROTEIN G AB, IGG: 8.23 IV — AB

## 2015-01-17 ENCOUNTER — Encounter: Payer: Self-pay | Admitting: Obstetrics & Gynecology

## 2015-01-17 ENCOUNTER — Ambulatory Visit (INDEPENDENT_AMBULATORY_CARE_PROVIDER_SITE_OTHER): Payer: BLUE CROSS/BLUE SHIELD | Admitting: Obstetrics & Gynecology

## 2015-01-17 VITALS — BP 118/70 | Wt 148.0 lb

## 2015-01-17 DIAGNOSIS — Z3493 Encounter for supervision of normal pregnancy, unspecified, third trimester: Secondary | ICD-10-CM

## 2015-01-17 DIAGNOSIS — Z1389 Encounter for screening for other disorder: Secondary | ICD-10-CM

## 2015-01-17 DIAGNOSIS — O359XX1 Maternal care for (suspected) fetal abnormality and damage, unspecified, fetus 1: Secondary | ICD-10-CM

## 2015-01-17 DIAGNOSIS — Z331 Pregnant state, incidental: Secondary | ICD-10-CM

## 2015-01-17 LAB — POCT URINALYSIS DIPSTICK
Blood, UA: NEGATIVE
Glucose, UA: NEGATIVE
Ketones, UA: NEGATIVE
NITRITE UA: NEGATIVE
Protein, UA: NEGATIVE

## 2015-01-17 NOTE — Progress Notes (Signed)
G1P0 196w2d Estimated Date of Delivery: 03/19/15  Blood pressure 118/70, weight 148 lb (67.132 kg), last menstrual period 05/26/2014.   BP weight and urine results all reviewed and noted.  Please refer to the obstetrical flow sheet for the fundal height and fetal heart rate documentation:  Patient reports good fetal movement, denies any bleeding and no rupture of membranes symptoms or regular contractions. Patient is without complaints. All questions were answered.  Plan:  Continued routine obstetrical care,   Follow up in 2 weeks for OB appointment, sonogram to check on the cisterna magna size

## 2015-01-31 ENCOUNTER — Encounter: Payer: Self-pay | Admitting: Obstetrics & Gynecology

## 2015-01-31 ENCOUNTER — Other Ambulatory Visit: Payer: Self-pay | Admitting: Obstetrics & Gynecology

## 2015-01-31 ENCOUNTER — Ambulatory Visit (INDEPENDENT_AMBULATORY_CARE_PROVIDER_SITE_OTHER): Payer: BLUE CROSS/BLUE SHIELD | Admitting: Obstetrics & Gynecology

## 2015-01-31 ENCOUNTER — Ambulatory Visit (INDEPENDENT_AMBULATORY_CARE_PROVIDER_SITE_OTHER): Payer: BLUE CROSS/BLUE SHIELD

## 2015-01-31 VITALS — BP 120/90 | Wt 153.0 lb

## 2015-01-31 DIAGNOSIS — O359XX1 Maternal care for (suspected) fetal abnormality and damage, unspecified, fetus 1: Secondary | ICD-10-CM

## 2015-01-31 DIAGNOSIS — Z1389 Encounter for screening for other disorder: Secondary | ICD-10-CM

## 2015-01-31 DIAGNOSIS — Z3493 Encounter for supervision of normal pregnancy, unspecified, third trimester: Secondary | ICD-10-CM

## 2015-01-31 DIAGNOSIS — Z331 Pregnant state, incidental: Secondary | ICD-10-CM

## 2015-01-31 LAB — POCT URINALYSIS DIPSTICK
Blood, UA: NEGATIVE
GLUCOSE UA: NEGATIVE
Glucose, UA: NEGATIVE
KETONES UA: NEGATIVE
Nitrite, UA: NEGATIVE
PROTEIN UA: NEGATIVE
PROTEIN UA: NEGATIVE

## 2015-01-31 MED ORDER — ACYCLOVIR 400 MG PO TABS: 400.0000 mg | ORAL_TABLET | Freq: Three times a day (TID) | ORAL | Status: DC

## 2015-01-31 NOTE — Progress Notes (Signed)
U/S(33+2wks)-vtx active fetus, EFW 4 lb 9 oz (38th%tile), fluid WNL, anterior Gr 2 placenta, FHR-158 bpm, unable to view posterior fossa well due to fetal position although no obvious major abnl noted, female fetus

## 2015-01-31 NOTE — Progress Notes (Signed)
Sonogram is reviewed and do not see any reason to look again, will pass along to peds post natally  G1P0 3570w2d Estimated Date of Delivery: 03/19/15  Blood pressure 120/90, weight 153 lb (69.4 kg), last menstrual period 05/26/2014.   BP weight and urine results all reviewed and noted.  Please refer to the obstetrical flow sheet for the fundal height and fetal heart rate documentation:  Patient reports good fetal movement, denies any bleeding and no rupture of membranes symptoms or regular contractions. Patient is without complaints. All questions were answered.  Plan:  Continued routine obstetrical care,   Follow up in 2 weeks for OB appointment, routine

## 2015-02-18 ENCOUNTER — Encounter: Payer: Self-pay | Admitting: Advanced Practice Midwife

## 2015-02-18 ENCOUNTER — Ambulatory Visit (INDEPENDENT_AMBULATORY_CARE_PROVIDER_SITE_OTHER): Payer: BLUE CROSS/BLUE SHIELD | Admitting: Advanced Practice Midwife

## 2015-02-18 VITALS — BP 110/66 | Wt 151.0 lb

## 2015-02-18 DIAGNOSIS — Z3403 Encounter for supervision of normal first pregnancy, third trimester: Secondary | ICD-10-CM

## 2015-02-18 DIAGNOSIS — Z87898 Personal history of other specified conditions: Secondary | ICD-10-CM

## 2015-02-18 DIAGNOSIS — O360931 Maternal care for other rhesus isoimmunization, third trimester, fetus 1: Secondary | ICD-10-CM

## 2015-02-18 DIAGNOSIS — F1291 Cannabis use, unspecified, in remission: Secondary | ICD-10-CM

## 2015-02-18 DIAGNOSIS — F1290 Cannabis use, unspecified, uncomplicated: Secondary | ICD-10-CM

## 2015-02-18 DIAGNOSIS — O283 Abnormal ultrasonic finding on antenatal screening of mother: Secondary | ICD-10-CM

## 2015-02-18 MED ORDER — RHO D IMMUNE GLOBULIN 1500 UNIT/2ML IJ SOSY
300.0000 ug | PREFILLED_SYRINGE | Freq: Once | INTRAMUSCULAR | Status: AC
  Administered 2015-02-18: 300 ug via INTRAMUSCULAR

## 2015-02-18 NOTE — Progress Notes (Signed)
Pt denies any problems or concerns at this time.  

## 2015-02-18 NOTE — Progress Notes (Signed)
G1P0 6774w6d Estimated Date of Delivery: 03/19/15  Last menstrual period 05/26/2014.   BP weight and urine results all reviewed and noted.  Please refer to the obstetrical flow sheet for the fundal height and fetal heart rate documentation:  Patient reports good fetal movement, denies any bleeding and no rupture of membranes symptoms or regular contractions. Patient is without complaints. 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  Healing well.  All questions were answered.  Plan:  Continued routine obstetrical care, rhogam today.  UDS (hx THC early pregnancy).  Order NExplanon  Follow up in 1 weeks for OB appointment, GBS    3

## 2015-02-19 LAB — PMP SCREEN PROFILE (10S), URINE
AMPHETAMINE SCRN UR: NEGATIVE ng/mL
Barbiturate Screen, Ur: NEGATIVE ng/mL
Benzodiazepine Screen, Urine: NEGATIVE ng/mL
CANNABINOIDS UR QL SCN: POSITIVE ng/mL
CREATININE(CRT), U: 50 mg/dL (ref 20.0–300.0)
Cocaine(Metab.)Screen, Urine: NEGATIVE ng/mL
Methadone Scn, Ur: NEGATIVE ng/mL
OXYCODONE+OXYMORPHONE UR QL SCN: NEGATIVE ng/mL
Opiate Scrn, Ur: NEGATIVE ng/mL
PCP SCRN UR: NEGATIVE ng/mL
PH UR, DRUG SCRN: 6.5 (ref 4.5–8.9)
PROPOXYPHENE SCREEN: NEGATIVE ng/mL

## 2015-02-25 ENCOUNTER — Ambulatory Visit (INDEPENDENT_AMBULATORY_CARE_PROVIDER_SITE_OTHER): Payer: BLUE CROSS/BLUE SHIELD | Admitting: Women's Health

## 2015-02-25 ENCOUNTER — Encounter: Payer: Self-pay | Admitting: Women's Health

## 2015-02-25 VITALS — BP 122/70 | HR 80 | Wt 154.0 lb

## 2015-02-25 DIAGNOSIS — F1290 Cannabis use, unspecified, uncomplicated: Secondary | ICD-10-CM

## 2015-02-25 DIAGNOSIS — Z331 Pregnant state, incidental: Secondary | ICD-10-CM

## 2015-02-25 DIAGNOSIS — Z3685 Encounter for antenatal screening for Streptococcus B: Secondary | ICD-10-CM

## 2015-02-25 DIAGNOSIS — O360131 Maternal care for anti-D [Rh] antibodies, third trimester, fetus 1: Secondary | ICD-10-CM

## 2015-02-25 DIAGNOSIS — Z3403 Encounter for supervision of normal first pregnancy, third trimester: Secondary | ICD-10-CM

## 2015-02-25 DIAGNOSIS — Z118 Encounter for screening for other infectious and parasitic diseases: Secondary | ICD-10-CM

## 2015-02-25 DIAGNOSIS — Z1159 Encounter for screening for other viral diseases: Secondary | ICD-10-CM

## 2015-02-25 DIAGNOSIS — Z1389 Encounter for screening for other disorder: Secondary | ICD-10-CM

## 2015-02-25 LAB — POCT URINALYSIS DIPSTICK
Glucose, UA: NEGATIVE
Ketones, UA: NEGATIVE
LEUKOCYTES UA: NEGATIVE
Nitrite, UA: NEGATIVE
Protein, UA: NEGATIVE
RBC UA: NEGATIVE

## 2015-02-25 LAB — OB RESULTS CONSOLE GBS: GBS: POSITIVE

## 2015-02-25 LAB — OB RESULTS CONSOLE GC/CHLAMYDIA
Chlamydia: NEGATIVE
Gonorrhea: NEGATIVE

## 2015-02-25 NOTE — Progress Notes (Signed)
Low-risk OB appointment G1P0 137w6d Estimated Date of Delivery: 03/19/15 BP 122/70 mmHg  Pulse 80  Wt 154 lb (69.854 kg)  LMP 05/26/2014  BP, weight, and urine reviewed.  Refer to obstetrical flow sheet for FH & FHR.  Reports good fm.  Denies regular uc's, lof, vb, or uti s/s. No complaints. Notified Diane Doyle urine was still pos for THC, states she hasn't smoked since beginning of pregnancy, she is just around those who do. Understands SW will visit Diane Doyle in hospital.  GBS collected SVE per request: 1.5/50/-3, vtx Reviewed labor s/s, fkc. Plan:  Continue routine obstetrical care  F/U in 1wk for OB appointment

## 2015-02-25 NOTE — Patient Instructions (Signed)
Call the office ([phone]) or go to [facility] if:  You begin to have strong, frequent contractions  Your water breaks.  Sometimes it is a big gush of fluid, sometimes it is just a trickle that keeps getting your panties wet or running down your legs  You have vaginal bleeding.  It is normal to have a small amount of spotting if your cervix was checked.   You don't feel your baby moving like normal.  If you don't, get you something to eat and drink and lay down and focus on feeling your baby move.  You should feel at least 10 movements in 2 hours.  If you don't, you should call the office or go to [facility].    Braxton Hicks Contractions Contractions of the uterus can occur throughout pregnancy. Contractions are not always a sign that you are in labor.  WHAT ARE BRAXTON HICKS CONTRACTIONS?  Contractions that occur before labor are called Braxton Hicks contractions, or false labor. Toward the end of pregnancy (32-34 weeks), these contractions can develop more often and may become more forceful. This is not true labor because these contractions do not result in opening (dilatation) and thinning of the cervix. They are sometimes difficult to tell apart from true labor because these contractions can be forceful and people have different pain tolerances. You should not feel embarrassed if you go to the hospital with false labor. Sometimes, the only way to tell if you are in true labor is for your health care provider to look for changes in the cervix. If there are no prenatal problems or other health problems associated with the pregnancy, it is completely safe to be sent home with false labor and await the onset of true labor. HOW CAN YOU TELL THE DIFFERENCE BETWEEN TRUE AND FALSE LABOR? False Labor  The contractions of false labor are usually shorter and not as hard as those of true labor.   The contractions are usually irregular.   The contractions are often felt in the front of  the lower abdomen and in the groin.   The contractions may go away when you walk around or change positions while lying down.   The contractions get weaker and are shorter lasting as time goes on.   The contractions do not usually become progressively stronger, regular, and closer together as with true labor.  True Labor  Contractions in true labor last 30-70 seconds, become very regular, usually become more intense, and increase in frequency.   The contractions do not go away with walking.   The discomfort is usually felt in the top of the uterus and spreads to the lower abdomen and low back.   True labor can be determined by your health care provider with an exam. This will show that the cervix is dilating and getting thinner.  WHAT TO REMEMBER  Keep up with your usual exercises and follow other instructions given by your health care provider.   Take medicines as directed by your health care provider.   Keep your regular prenatal appointments.   Eat and drink lightly if you think you are going into labor.   If Braxton Hicks contractions are making you uncomfortable:   Change your position from lying down or resting to walking, or from walking to resting.   Sit and rest in a tub of warm water.   Drink 2-3 glasses of water. Dehydration may cause these contractions.   Do slow and deep breathing several times an hour.    WHEN SHOULD I SEEK IMMEDIATE MEDICAL CARE? Seek immediate medical care if:  Your contractions become stronger, more regular, and closer together.   You have fluid leaking or gushing from your vagina.   You have a fever.   You pass blood-tinged mucus.   You have vaginal bleeding.   You have continuous abdominal pain.   You have low back pain that you never had before.   You feel your baby's head pushing down and causing pelvic pressure.   Your baby is not moving as much as it used to.  Document Released: 12/06/2005 Document  Revised: 12/11/2013 Document Reviewed: 09/17/2013 [employer] Patient Information 2015 [employer]. This information is not intended to replace advice given to you by your health care provider. Make sure you discuss any questions you have with your health care provider.  

## 2015-02-26 LAB — GC/CHLAMYDIA PROBE AMP
Chlamydia trachomatis, NAA: NEGATIVE
NEISSERIA GONORRHOEAE BY PCR: NEGATIVE

## 2015-02-28 LAB — CULTURE, BETA STREP (GROUP B ONLY): Strep Gp B Culture: POSITIVE — AB

## 2015-03-07 ENCOUNTER — Encounter: Payer: BLUE CROSS/BLUE SHIELD | Admitting: Obstetrics & Gynecology

## 2015-03-07 ENCOUNTER — Encounter: Payer: Self-pay | Admitting: Obstetrics & Gynecology

## 2015-03-18 ENCOUNTER — Ambulatory Visit (INDEPENDENT_AMBULATORY_CARE_PROVIDER_SITE_OTHER): Payer: BLUE CROSS/BLUE SHIELD | Admitting: Obstetrics & Gynecology

## 2015-03-18 VITALS — BP 110/82 | HR 60 | Wt 163.0 lb

## 2015-03-18 DIAGNOSIS — Z331 Pregnant state, incidental: Secondary | ICD-10-CM

## 2015-03-18 DIAGNOSIS — Z3403 Encounter for supervision of normal first pregnancy, third trimester: Secondary | ICD-10-CM | POA: Diagnosis not present

## 2015-03-18 DIAGNOSIS — Z1389 Encounter for screening for other disorder: Secondary | ICD-10-CM

## 2015-03-18 LAB — POCT URINALYSIS DIPSTICK
GLUCOSE UA: NEGATIVE
KETONES UA: NEGATIVE
Nitrite, UA: NEGATIVE
Protein, UA: NEGATIVE
RBC UA: NEGATIVE

## 2015-03-18 NOTE — Progress Notes (Signed)
G1P0 6563w6d Estimated Date of Delivery: 03/19/15  Blood pressure 110/82, pulse 60, weight 163 lb (73.936 kg), last menstrual period 05/26/2014.   BP weight and urine results all reviewed and noted.  Please refer to the obstetrical flow sheet for the fundal height and fetal heart rate documentation:  Patient reports good fetal movement, denies any bleeding and no rupture of membranes symptoms or regular contractions. Patient is without complaints. All questions were answered.  Plan:  Continued routine obstetrical care,   Follow up in 6 weeks for pp appointment,  Induction next week Wednesday 03/25/2104

## 2015-03-19 ENCOUNTER — Telehealth (HOSPITAL_COMMUNITY): Payer: Self-pay | Admitting: *Deleted

## 2015-03-19 NOTE — Telephone Encounter (Signed)
Preadmission screen  

## 2015-03-22 ENCOUNTER — Encounter (HOSPITAL_COMMUNITY): Payer: Self-pay

## 2015-03-22 ENCOUNTER — Inpatient Hospital Stay (HOSPITAL_COMMUNITY)
Admission: AD | Admit: 2015-03-22 | Discharge: 2015-03-22 | Disposition: A | Discharge status: Home / Self Care | Payer: Medicaid Other | Source: Ambulatory Visit | Attending: Obstetrics & Gynecology | Admitting: Obstetrics & Gynecology

## 2015-03-22 DIAGNOSIS — R109 Unspecified abdominal pain: Secondary | ICD-10-CM | POA: Diagnosis present

## 2015-03-22 DIAGNOSIS — O9989 Other specified diseases and conditions complicating pregnancy, childbirth and the puerperium: Secondary | ICD-10-CM | POA: Diagnosis not present

## 2015-03-22 DIAGNOSIS — Z3A40 40 weeks gestation of pregnancy: Secondary | ICD-10-CM | POA: Diagnosis not present

## 2015-03-22 MED ORDER — ONDANSETRON 4 MG PO TBDP: 4.0000 mg | ORAL_TABLET | Freq: Once | ORAL | Status: DC

## 2015-03-22 MED ORDER — ONDANSETRON 8 MG PO TBDP
8.0000 mg | ORAL_TABLET | Freq: Once | ORAL | Status: AC
Start: 2015-03-22 — End: 2015-03-22
  Administered 2015-03-22: 8 mg via ORAL
  Filled 2015-03-22: qty 1

## 2015-03-22 NOTE — MAU Note (Signed)
Since 0700 i have had bad abd cramps which have gotten worse. Can't eat and thrown up 4 times. No diarrhea.

## 2015-03-22 NOTE — Discharge Instructions (Signed)
Third Trimester of Pregnancy °The third trimester is from week 29 through week 42, months 7 through 9. The third trimester is a time when the fetus is growing rapidly. At the end of the ninth month, the fetus is about 20 inches in length and weighs 6-10 pounds.  °BODY CHANGES °Your body goes through many changes during pregnancy. The changes vary from woman to woman.  °· Your weight will continue to increase. You can expect to gain 25-35 pounds (11-16 kg) by the end of the pregnancy. °· You may begin to get stretch marks on your hips, abdomen, and breasts. °· You may urinate more often because the fetus is moving lower into your pelvis and pressing on your bladder. °· You may develop or continue to have heartburn as a result of your pregnancy. °· You may develop constipation because certain hormones are causing the muscles that push waste through your intestines to slow down. °· You may develop hemorrhoids or swollen, bulging veins (varicose veins). °· You may have pelvic pain because of the weight gain and pregnancy hormones relaxing your joints between the bones in your pelvis. Backaches may result from overexertion of the muscles supporting your posture. °· You may have changes in your hair. These can include thickening of your hair, rapid growth, and changes in texture. Some women also have hair loss during or after pregnancy, or hair that feels dry or thin. Your hair will most likely return to normal after your baby is born. °· Your breasts will continue to grow and be tender. A yellow discharge may leak from your breasts called colostrum. °· Your belly button may stick out. °· You may feel short of breath because of your expanding uterus. °· You may notice the fetus "dropping," or moving lower in your abdomen. °· You may have a bloody mucus discharge. This usually occurs a few days to a week before labor begins. °· Your cervix becomes thin and soft (effaced) near your due date. °WHAT TO EXPECT AT YOUR PRENATAL  EXAMS  °You will have prenatal exams every 2 weeks until week 36. Then, you will have weekly prenatal exams. During a routine prenatal visit: °· You will be weighed to make sure you and the fetus are growing normally. °· Your blood pressure is taken. °· Your abdomen will be measured to track your baby's growth. °· The fetal heartbeat will be listened to. °· Any test results from the previous visit will be discussed. °· You may have a cervical check near your due date to see if you have effaced. °At around 36 weeks, your caregiver will check your cervix. At the same time, your caregiver will also perform a test on the secretions of the vaginal tissue. This test is to determine if a type of bacteria, Group B streptococcus, is present. Your caregiver will explain this further. °Your caregiver may ask you: °· What your birth plan is. °· How you are feeling. °· If you are feeling the baby move. °· If you have had any abnormal symptoms, such as leaking fluid, bleeding, severe headaches, or abdominal cramping. °· If you have any questions. °Other tests or screenings that may be performed during your third trimester include: °· Blood tests that check for low iron levels (anemia). °· Fetal testing to check the health, activity level, and growth of the fetus. Testing is done if you have certain medical conditions or if there are problems during the pregnancy. °FALSE LABOR °You may feel small, irregular contractions that   eventually go away. These are called Braxton Hicks contractions, or false labor. Contractions may last for hours, days, or even weeks before true labor sets in. If contractions come at regular intervals, intensify, or become painful, it is best to be seen by your caregiver.  °SIGNS OF LABOR  °· Menstrual-like cramps. °· Contractions that are 5 minutes apart or less. °· Contractions that start on the top of the uterus and spread down to the lower abdomen and back. °· A sense of increased pelvic pressure or back  pain. °· A watery or bloody mucus discharge that comes from the vagina. °If you have any of these signs before the 37th week of pregnancy, call your caregiver right away. You need to go to the hospital to get checked immediately. °HOME CARE INSTRUCTIONS  °· Avoid all smoking, herbs, alcohol, and unprescribed drugs. These chemicals affect the formation and growth of the baby. °· Follow your caregiver's instructions regarding medicine use. There are medicines that are either safe or unsafe to take during pregnancy. °· Exercise only as directed by your caregiver. Experiencing uterine cramps is a good sign to stop exercising. °· Continue to eat regular, healthy meals. °· Wear a good support bra for breast tenderness. °· Do not use hot tubs, steam rooms, or saunas. °· Wear your seat belt at all times when driving. °· Avoid raw meat, uncooked cheese, cat litter boxes, and soil used by cats. These carry germs that can cause birth defects in the baby. °· Take your prenatal vitamins. °· Try taking a stool softener (if your caregiver approves) if you develop constipation. Eat more high-fiber foods, such as fresh vegetables or fruit and whole grains. Drink plenty of fluids to keep your urine clear or pale yellow. °· Take warm sitz baths to soothe any pain or discomfort caused by hemorrhoids. Use hemorrhoid cream if your caregiver approves. °· If you develop varicose veins, wear support hose. Elevate your feet for 15 minutes, 3-4 times a day. Limit salt in your diet. °· Avoid heavy lifting, wear low heal shoes, and practice good posture. °· Rest a lot with your legs elevated if you have leg cramps or low back pain. °· Visit your dentist if you have not gone during your pregnancy. Use a soft toothbrush to brush your teeth and be gentle when you floss. °· A sexual relationship may be continued unless your caregiver directs you otherwise. °· Do not travel far distances unless it is absolutely necessary and only with the approval  of your caregiver. °· Take prenatal classes to understand, practice, and ask questions about the labor and delivery. °· Make a trial run to the hospital. °· Pack your hospital bag. °· Prepare the baby's nursery. °· Continue to go to all your prenatal visits as directed by your caregiver. °SEEK MEDICAL CARE IF: °· You are unsure if you are in labor or if your water has broken. °· You have dizziness. °· You have mild pelvic cramps, pelvic pressure, or nagging pain in your abdominal area. °· You have persistent nausea, vomiting, or diarrhea. °· You have a bad smelling vaginal discharge. °· You have pain with urination. °SEEK IMMEDIATE MEDICAL CARE IF:  °· You have a fever. °· You are leaking fluid from your vagina. °· You have spotting or bleeding from your vagina. °· You have severe abdominal cramping or pain. °· You have rapid weight loss or gain. °· You have shortness of breath with chest pain. °· You notice sudden or extreme swelling   of your face, hands, ankles, feet, or legs. °· You have not felt your baby move in over an hour. °· You have severe headaches that do not go away with medicine. °· You have vision changes. °Document Released: 11/30/2001 Document Revised: 12/11/2013 Document Reviewed: 02/06/2013 °[employer]® Patient Information ©2015 [employer]. This information is not intended to replace advice given to you by your health care provider. Make sure you discuss any questions you have with your health care provider. °Fetal Movement Counts °Patient Name: __________________________________________________ Patient Due Date: ____________________ °Performing a fetal movement count is highly recommended in high-risk pregnancies, but it is good for every pregnant woman to do. Your health care provider may ask you to start counting fetal movements at 28 weeks of the pregnancy. Fetal movements often increase: °· After eating a full meal. °· After physical activity. °· After eating or drinking something sweet or  cold. °· At rest. °Pay attention to when you feel the baby is most active. This will help you notice a pattern of your baby's sleep and wake cycles and what factors contribute to an increase in fetal movement. It is important to perform a fetal movement count at the same time each day when your baby is normally most active.  °HOW TO COUNT FETAL MOVEMENTS °· Find a quiet and comfortable area to sit or lie down on your left side. Lying on your left side provides the best blood and oxygen circulation to your baby. °· Write down the day and time on a sheet of paper or in a journal. °· Start counting kicks, flutters, swishes, rolls, or jabs in a 2-hour period. You should feel at least 10 movements within 2 hours. °· If you do not feel 10 movements in 2 hours, wait 2-3 hours and count again. Look for a change in the pattern or not enough counts in 2 hours. °SEEK MEDICAL CARE IF: °· You feel less than 10 counts in 2 hours, tried twice. °· There is no movement in over an hour. °· The pattern is changing or taking longer each day to reach 10 counts in 2 hours. °· You feel the baby is not moving as he or she usually does. °Date: ____________ Movements: ____________ Start time: ____________ Finish time: ____________  °Date: ____________ Movements: ____________ Start time: ____________ Finish time: ____________ °Date: ____________ Movements: ____________ Start time: ____________ Finish time: ____________ °Date: ____________ Movements: ____________ Start time: ____________ Finish time: ____________ °Date: ____________ Movements: ____________ Start time: ____________ Finish time: ____________ °Date: ____________ Movements: ____________ Start time: ____________ Finish time: ____________ °Date: ____________ Movements: ____________ Start time: ____________ Finish time: ____________ °Date: ____________ Movements: ____________ Start time: ____________ Finish time: ____________  °Date: ____________ Movements: ____________ Start time:  ____________ Finish time: ____________ °Date: ____________ Movements: ____________ Start time: ____________ Finish time: ____________ °Date: ____________ Movements: ____________ Start time: ____________ Finish time: ____________ °Date: ____________ Movements: ____________ Start time: ____________ Finish time: ____________ °Date: ____________ Movements: ____________ Start time: ____________ Finish time: ____________ °Date: ____________ Movements: ____________ Start time: ____________ Finish time: ____________ °Date: ____________ Movements: ____________ Start time: ____________ Finish time: ____________  °Date: ____________ Movements: ____________ Start time: ____________ Finish time: ____________ °Date: ____________ Movements: ____________ Start time: ____________ Finish time: ____________ °Date: ____________ Movements: ____________ Start time: ____________ Finish time: ____________ °Date: ____________ Movements: ____________ Start time: ____________ Finish time: ____________ °Date: ____________ Movements: ____________ Start time: ____________ Finish time: ____________ °Date: ____________ Movements: ____________ Start time: ____________ Finish time: ____________ °Date: ____________ Movements: ____________ Start time: ____________ Finish time:   ____________  °Date: ____________ Movements: ____________ Start time: ____________ Finish time: ____________ °Date: ____________ Movements: ____________ Start time: ____________ Finish time: ____________ °Date: ____________ Movements: ____________ Start time: ____________ Finish time: ____________ °Date: ____________ Movements: ____________ Start time: ____________ Finish time: ____________ °Date: ____________ Movements: ____________ Start time: ____________ Finish time: ____________ °Date: ____________ Movements: ____________ Start time: ____________ Finish time: ____________ °Date: ____________ Movements: ____________ Start time: ____________ Finish time: ____________  °Date:  ____________ Movements: ____________ Start time: ____________ Finish time: ____________ °Date: ____________ Movements: ____________ Start time: ____________ Finish time: ____________ °Date: ____________ Movements: ____________ Start time: ____________ Finish time: ____________ °Date: ____________ Movements: ____________ Start time: ____________ Finish time: ____________ °Date: ____________ Movements: ____________ Start time: ____________ Finish time: ____________ °Date: ____________ Movements: ____________ Start time: ____________ Finish time: ____________ °Date: ____________ Movements: ____________ Start time: ____________ Finish time: ____________  °Date: ____________ Movements: ____________ Start time: ____________ Finish time: ____________ °Date: ____________ Movements: ____________ Start time: ____________ Finish time: ____________ °Date: ____________ Movements: ____________ Start time: ____________ Finish time: ____________ °Date: ____________ Movements: ____________ Start time: ____________ Finish time: ____________ °Date: ____________ Movements: ____________ Start time: ____________ Finish time: ____________ °Date: ____________ Movements: ____________ Start time: ____________ Finish time: ____________ °Date: ____________ Movements: ____________ Start time: ____________ Finish time: ____________  °Date: ____________ Movements: ____________ Start time: ____________ Finish time: ____________ °Date: ____________ Movements: ____________ Start time: ____________ Finish time: ____________ °Date: ____________ Movements: ____________ Start time: ____________ Finish time: ____________ °Date: ____________ Movements: ____________ Start time: ____________ Finish time: ____________ °Date: ____________ Movements: ____________ Start time: ____________ Finish time: ____________ °Date: ____________ Movements: ____________ Start time: ____________ Finish time: ____________ °Date: ____________ Movements: ____________ Start  time: ____________ Finish time: ____________  °Date: ____________ Movements: ____________ Start time: ____________ Finish time: ____________ °Date: ____________ Movements: ____________ Start time: ____________ Finish time: ____________ °Date: ____________ Movements: ____________ Start time: ____________ Finish time: ____________ °Date: ____________ Movements: ____________ Start time: ____________ Finish time: ____________ °Date: ____________ Movements: ____________ Start time: ____________ Finish time: ____________ °Date: ____________ Movements: ____________ Start time: ____________ Finish time: ____________ °Document Released: 01/05/2007 Document Revised: 04/22/2014 Document Reviewed: 10/02/2012 °[employer]® Patient Information ©2015 [employer]. This information is not intended to replace advice given to you by your health care provider. Make sure you discuss any questions you have with your health care provider. °Braxton Hicks Contractions °Contractions of the uterus can occur throughout pregnancy. Contractions are not always a sign that you are in labor.  °WHAT ARE BRAXTON HICKS CONTRACTIONS?  °Contractions that occur before labor are called Braxton Hicks contractions, or false labor. Toward the end of pregnancy (32-34 weeks), these contractions can develop more often and may become more forceful. This is not true labor because these contractions do not result in opening (dilatation) and thinning of the cervix. They are sometimes difficult to tell apart from true labor because these contractions can be forceful and people have different pain tolerances. You should not feel embarrassed if you go to the hospital with false labor. Sometimes, the only way to tell if you are in true labor is for your health care provider to look for changes in the cervix. °If there are no prenatal problems or other health problems associated with the pregnancy, it is completely safe to be sent home with false labor and await the  onset of true labor. °HOW CAN YOU TELL THE DIFFERENCE BETWEEN TRUE AND FALSE LABOR? °False Labor °· The contractions of false labor are usually shorter and not as hard as those of true labor.   °· The contractions   are usually irregular.   °· The contractions are often felt in the front of the lower abdomen and in the groin.   °· The contractions may go away when you walk around or change positions while lying down.   °· The contractions get weaker and are shorter lasting as time goes on.   °· The contractions do not usually become progressively stronger, regular, and closer together as with true labor.   °True Labor °· Contractions in true labor last 30-70 seconds, become very regular, usually become more intense, and increase in frequency.   °· The contractions do not go away with walking.   °· The discomfort is usually felt in the top of the uterus and spreads to the lower abdomen and low back.   °· True labor can be determined by your health care provider with an exam. This will show that the cervix is dilating and getting thinner.   °WHAT TO REMEMBER °· Keep up with your usual exercises and follow other instructions given by your health care provider.   °· Take medicines as directed by your health care provider.   °· Keep your regular prenatal appointments.   °· Eat and drink lightly if you think you are going into labor.   °· If Braxton Hicks contractions are making you uncomfortable:   °· Change your position from lying down or resting to walking, or from walking to resting.   °· Sit and rest in a tub of warm water.   °· Drink 2-3 glasses of water. Dehydration may cause these contractions.   °· Do slow and deep breathing several times an hour.   °WHEN SHOULD I SEEK IMMEDIATE MEDICAL CARE? °Seek immediate medical care if: °· Your contractions become stronger, more regular, and closer together.   °· You have fluid leaking or gushing from your vagina.   °· You have a fever.   °· You pass blood-tinged mucus.    °· You have vaginal bleeding.   °· You have continuous abdominal pain.   °· You have low back pain that you never had before.   °· You feel your baby's head pushing down and causing pelvic pressure.   °· Your baby is not moving as much as it used to.   °Document Released: 12/06/2005 Document Revised: 12/11/2013 Document Reviewed: 09/17/2013 °[employer]® Patient Information ©2015 [employer]. This information is not intended to replace advice given to you by your health care provider. Make sure you discuss any questions you have with your health care provider. ° °

## 2015-03-25 ENCOUNTER — Inpatient Hospital Stay (HOSPITAL_COMMUNITY): Payer: Medicaid Other | Admitting: Anesthesiology

## 2015-03-25 ENCOUNTER — Encounter (HOSPITAL_COMMUNITY): Payer: Self-pay | Admitting: *Deleted

## 2015-03-25 ENCOUNTER — Inpatient Hospital Stay (HOSPITAL_COMMUNITY)
Admission: AD | Admit: 2015-03-25 | Discharge: 2015-03-27 | DRG: 767 | Disposition: A | Discharge status: Home / Self Care | Payer: Medicaid Other | Source: Ambulatory Visit | Attending: Family Medicine | Admitting: Family Medicine

## 2015-03-25 DIAGNOSIS — O99824 Streptococcus B carrier state complicating childbirth: Secondary | ICD-10-CM | POA: Diagnosis present

## 2015-03-25 DIAGNOSIS — IMO0001 Reserved for inherently not codable concepts without codable children: Secondary | ICD-10-CM

## 2015-03-25 DIAGNOSIS — Z87891 Personal history of nicotine dependence: Secondary | ICD-10-CM

## 2015-03-25 DIAGNOSIS — Z833 Family history of diabetes mellitus: Secondary | ICD-10-CM

## 2015-03-25 DIAGNOSIS — Z3A40 40 weeks gestation of pregnancy: Secondary | ICD-10-CM | POA: Diagnosis present

## 2015-03-25 DIAGNOSIS — Z3403 Encounter for supervision of normal first pregnancy, third trimester: Secondary | ICD-10-CM

## 2015-03-25 DIAGNOSIS — O283 Abnormal ultrasonic finding on antenatal screening of mother: Secondary | ICD-10-CM

## 2015-03-25 LAB — COMPREHENSIVE METABOLIC PANEL
ALK PHOS: 255 U/L — AB (ref 39–117)
ALT: 13 U/L (ref 0–35)
AST: 23 U/L (ref 0–37)
Albumin: 2.5 g/dL — ABNORMAL LOW (ref 3.5–5.2)
Anion gap: 9 (ref 5–15)
BILIRUBIN TOTAL: 0.8 mg/dL (ref 0.3–1.2)
BUN: 16 mg/dL (ref 6–23)
CHLORIDE: 106 mmol/L (ref 96–112)
CO2: 22 mmol/L (ref 19–32)
CREATININE: 0.98 mg/dL (ref 0.50–1.10)
Calcium: 8.4 mg/dL (ref 8.4–10.5)
GFR calc Af Amer: >90 mL/min (ref >90)
GFR, EST NON AFRICAN AMERICAN: 82 mL/min — AB (ref >90)
Glucose, Bld: 86 mg/dL (ref 70–99)
POTASSIUM: 4.2 mmol/L (ref 3.5–5.1)
Sodium: 137 mmol/L (ref 135–145)
Total Protein: 5.8 g/dL — ABNORMAL LOW (ref 6.0–8.3)

## 2015-03-25 LAB — CBC
HCT: 38.9 % (ref 36.0–46.0)
HCT: 36.2 % (ref 36.0–46.0)
HEMOGLOBIN: 12.9 g/dL (ref 12.0–15.0)
HEMOGLOBIN: 12.3 g/dL (ref 12.0–15.0)
MCH: 29.4 pg (ref 26.0–34.0)
MCH: 29.9 pg (ref 26.0–34.0)
MCHC: 33.2 g/dL (ref 30.0–36.0)
MCHC: 34 g/dL (ref 30.0–36.0)
MCV: 88.6 fL (ref 78.0–100.0)
MCV: 88.1 fL (ref 78.0–100.0)
Platelets: 250 10*3/uL (ref 150–400)
Platelets: 235 10*3/uL (ref 150–400)
RBC: 4.39 MIL/uL (ref 3.87–5.11)
RBC: 4.11 MIL/uL (ref 3.87–5.11)
RDW: 14.1 % (ref 11.5–15.5)
RDW: 14.3 % (ref 11.5–15.5)
WBC: 11.1 10*3/uL — ABNORMAL HIGH (ref 4.0–10.5)
WBC: 13.6 10*3/uL — ABNORMAL HIGH (ref 4.0–10.5)

## 2015-03-25 LAB — PROTEIN / CREATININE RATIO, URINE
Creatinine, Urine: 99 mg/dL
PROTEIN CREATININE RATIO: 0.19 — AB (ref 0.00–0.15)
TOTAL PROTEIN, URINE: 19 mg/dL

## 2015-03-25 LAB — POCT FERN TEST

## 2015-03-25 MED ORDER — ONDANSETRON HCL 4 MG/2ML IJ SOLN
4.0000 mg | Freq: Four times a day (QID) | INTRAMUSCULAR | Status: DC | PRN
  Administered 2015-03-25: 4 mg via INTRAVENOUS
  Filled 2015-03-25: qty 2

## 2015-03-25 MED ORDER — FENTANYL 2.5 MCG/ML BUPIVACAINE 1/10 % EPIDURAL INFUSION (WH - ANES)
14.0000 mL/h | INTRAMUSCULAR | Status: DC | PRN
  Administered 2015-03-25: 14 mL/h via EPIDURAL
  Filled 2015-03-25 (×2): qty 125

## 2015-03-25 MED ORDER — EPHEDRINE 5 MG/ML INJ
10.0000 mg | INTRAVENOUS | Status: DC | PRN
Start: 2015-03-25 — End: 2015-03-26
  Filled 2015-03-25: qty 2

## 2015-03-25 MED ORDER — EPHEDRINE 5 MG/ML INJ
10.0000 mg | INTRAVENOUS | Status: DC | PRN
  Filled 2015-03-25: qty 2

## 2015-03-25 MED ORDER — PHENYLEPHRINE 40 MCG/ML (10ML) SYRINGE FOR IV PUSH (FOR BLOOD PRESSURE SUPPORT)
80.0000 ug | PREFILLED_SYRINGE | INTRAVENOUS | Status: DC | PRN
  Filled 2015-03-25: qty 2
  Filled 2015-03-25: qty 20

## 2015-03-25 MED ORDER — OXYCODONE-ACETAMINOPHEN 5-325 MG PO TABS
2.0000 | ORAL_TABLET | ORAL | Status: DC | PRN
  Administered 2015-03-26 – 2015-03-27 (×6): 2 via ORAL
  Filled 2015-03-25 (×6): qty 2

## 2015-03-25 MED ORDER — LACTATED RINGERS IV SOLN: 500.0000 mL | Freq: Once | INTRAVENOUS | Status: DC

## 2015-03-25 MED ORDER — LACTATED RINGERS IV SOLN
INTRAVENOUS | Status: DC
  Administered 2015-03-25 (×2): via INTRAVENOUS

## 2015-03-25 MED ORDER — OXYCODONE-ACETAMINOPHEN 5-325 MG PO TABS
1.0000 | ORAL_TABLET | ORAL | Status: DC | PRN
  Administered 2015-03-26 (×2): 1 via ORAL
  Filled 2015-03-25 (×2): qty 1

## 2015-03-25 MED ORDER — MISOPROSTOL 200 MCG PO TABS
ORAL_TABLET | ORAL | Status: AC
  Filled 2015-03-25: qty 5

## 2015-03-25 MED ORDER — OXYTOCIN 40 UNITS IN LACTATED RINGERS INFUSION - SIMPLE MED
1.0000 m[IU]/min | INTRAVENOUS | Status: DC
  Administered 2015-03-25: 2 m[IU]/min via INTRAVENOUS

## 2015-03-25 MED ORDER — ACETAMINOPHEN 325 MG PO TABS: 650.0000 mg | ORAL_TABLET | ORAL | Status: DC | PRN

## 2015-03-25 MED ORDER — FENTANYL CITRATE 0.05 MG/ML IJ SOLN
100.0000 ug | INTRAMUSCULAR | Status: DC | PRN
  Filled 2015-03-25: qty 2

## 2015-03-25 MED ORDER — IBUPROFEN 600 MG PO TABS
600.0000 mg | ORAL_TABLET | Freq: Four times a day (QID) | ORAL | Status: DC
  Administered 2015-03-26 – 2015-03-27 (×8): 600 mg via ORAL
  Filled 2015-03-25 (×8): qty 1

## 2015-03-25 MED ORDER — FENTANYL CITRATE 0.05 MG/ML IJ SOLN
100.0000 ug | INTRAMUSCULAR | Status: DC | PRN
  Administered 2015-03-25: 100 ug via INTRAVENOUS
  Filled 2015-03-25: qty 2

## 2015-03-25 MED ORDER — OXYTOCIN BOLUS FROM INFUSION: 500.0000 mL | INTRAVENOUS | Status: DC

## 2015-03-25 MED ORDER — CITRIC ACID-SODIUM CITRATE 334-500 MG/5ML PO SOLN: 30.0000 mL | ORAL | Status: DC | PRN

## 2015-03-25 MED ORDER — FLEET ENEMA 7-19 GM/118ML RE ENEM: 1.0000 | ENEMA | RECTAL | Status: DC | PRN

## 2015-03-25 MED ORDER — DIPHENHYDRAMINE HCL 50 MG/ML IJ SOLN: 12.5000 mg | INTRAMUSCULAR | Status: DC | PRN

## 2015-03-25 MED ORDER — DEXTROSE 5 % IV SOLN
5.0000 10*6.[IU] | Freq: Once | INTRAVENOUS | Status: AC
  Administered 2015-03-25: 5 10*6.[IU] via INTRAVENOUS
  Filled 2015-03-25: qty 5

## 2015-03-25 MED ORDER — MISOPROSTOL 200 MCG PO TABS
1000.0000 ug | ORAL_TABLET | Freq: Once | ORAL | Status: AC
  Administered 2015-03-25: 1000 ug via RECTAL

## 2015-03-25 MED ORDER — FENTANYL 2.5 MCG/ML BUPIVACAINE 1/10 % EPIDURAL INFUSION (WH - ANES)
INTRAMUSCULAR | Status: DC | PRN
  Administered 2015-03-25: 14 mL/h via EPIDURAL

## 2015-03-25 MED ORDER — CEFAZOLIN SODIUM-DEXTROSE 2-3 GM-% IV SOLR
2.0000 g | Freq: Once | INTRAVENOUS | Status: AC
  Administered 2015-03-25: 2 g via INTRAVENOUS
  Filled 2015-03-25: qty 50

## 2015-03-25 MED ORDER — LIDOCAINE HCL (PF) 1 % IJ SOLN
30.0000 mL | INTRAMUSCULAR | Status: DC | PRN
  Filled 2015-03-25: qty 30

## 2015-03-25 MED ORDER — LIDOCAINE HCL (PF) 1 % IJ SOLN
INTRAMUSCULAR | Status: DC | PRN
  Administered 2015-03-25 (×2): 4 mL

## 2015-03-25 MED ORDER — TERBUTALINE SULFATE 1 MG/ML IJ SOLN: 0.2500 mg | Freq: Once | INTRAMUSCULAR | Status: AC | PRN

## 2015-03-25 MED ORDER — LACTATED RINGERS IV SOLN: 500.0000 mL | INTRAVENOUS | Status: DC | PRN

## 2015-03-25 MED ORDER — PENICILLIN G POTASSIUM 5000000 UNITS IJ SOLR
2.5000 10*6.[IU] | INTRAVENOUS | Status: DC
  Administered 2015-03-25 (×2): 2.5 10*6.[IU] via INTRAVENOUS
  Filled 2015-03-25 (×8): qty 2.5

## 2015-03-25 MED ORDER — OXYCODONE-ACETAMINOPHEN 5-325 MG PO TABS: 1.0000 | ORAL_TABLET | ORAL | Status: DC | PRN

## 2015-03-25 MED ORDER — PHENYLEPHRINE 40 MCG/ML (10ML) SYRINGE FOR IV PUSH (FOR BLOOD PRESSURE SUPPORT)
80.0000 ug | PREFILLED_SYRINGE | INTRAVENOUS | Status: DC | PRN
  Filled 2015-03-25: qty 2

## 2015-03-25 MED ORDER — OXYTOCIN 40 UNITS IN LACTATED RINGERS INFUSION - SIMPLE MED
62.5000 mL/h | INTRAVENOUS | Status: DC
  Filled 2015-03-25: qty 1000

## 2015-03-25 MED ORDER — OXYCODONE-ACETAMINOPHEN 5-325 MG PO TABS: 2.0000 | ORAL_TABLET | ORAL | Status: DC | PRN

## 2015-03-25 NOTE — MAU Note (Signed)
C/o SROM @ 0545; c/o ucs after SROM;

## 2015-03-25 NOTE — MAU Note (Signed)
Pt states her water broke around 0540, clear pinkish fluid, still leaking some now.  Contractions started after leaking, denies bleeding.

## 2015-03-25 NOTE — H&P (Signed)
Diane Doyle is a 19 y.o. female G1P0 presenting for contractions and leakage of clear fluid, both starting at 7 am this morning.  She reports good fetal movement, denies vaginal bleeding, vaginal itching/burning, urinary symptoms, h/a, dizziness, n/v, or fever/chills.     [facility]  FOB [place], [place], 2nd baby, dating  Dating By 9wk u/s  Pap <21yo  GC/CT Initial:   -/-             36+wks:  -/-  Genetic Screen NT/IT:   negative  CF screen neg  Anatomic [place] Prominent cisterna magna; 33wks:  Not seen  Flu vaccine declined  Tdap Recommended ~ 28wks  Glucose Screen  2 hr  90/153/82  GBS Pos  Feed Preference Breast and bottle  Contraception nexplanon  Circumcision female  Childbirth Classes no  Pediatrician [facility]   Maternal Medical History:  Reason for admission: Rupture of membranes and contractions.  Nausea.  Contractions: Onset was 3-5 hours ago.   Frequency: regular.   Duration is approximately 1 minute.   Perceived severity is strong.    Fetal activity: Perceived fetal activity is normal.   Last perceived fetal movement was within the past hour.    Prenatal complications: no prenatal complications Prenatal Complications - Diabetes: none.    OB History    Gravida Para Term Preterm AB TAB SAB Ectopic Multiple Living   1              Past Medical History  Diagnosis Date  . Medical history non-contributory    Past Surgical History  Procedure Laterality Date  . Tonsillectomy    . Adenoidectomy     Family History: family history includes Diabetes in her father. Social History:  reports that she has quit smoking. Her smoking use included Cigarettes. She has never used smokeless tobacco. She reports that she does not drink alcohol or use illicit drugs.   Prenatal Transfer Tool  Maternal Diabetes: No Genetic Screening: Normal Maternal Ultrasounds/Referrals: Abnormal:  Findings:   Other: Fetal Ultrasounds or other Referrals:  None Maternal  Substance Abuse:  Yes:  Type: Marijuana Significant Maternal Medications:  None Significant Maternal Lab Results:  Lab values include: Group B Strep positive Other Comments:  Prominent cisterna magna; 33wks:  Not seen, resolved vs poor visualization  Review of Systems  Constitutional: Negative for fever, chills and malaise/fatigue.  Eyes: Negative for blurred vision.  Respiratory: Negative for cough and shortness of breath.   Cardiovascular: Negative for chest pain.  Gastrointestinal: Positive for abdominal pain. Negative for heartburn, nausea and vomiting.  Genitourinary: Negative for dysuria, urgency and frequency.  Musculoskeletal: Negative.   Neurological: Negative for dizziness and headaches.  Psychiatric/Behavioral: Negative for depression.    Dilation: 4.5 Effacement (%): 80 Station: -2 Exam by:: Morrison Old RN Blood pressure 141/99, pulse 64, temperature 98.3 F (36.8 C), temperature source Oral, resp. rate 18, last menstrual period 05/26/2014.    Maternal Exam:  Uterine Assessment: Contraction strength is moderate.  Contraction duration is 70 seconds. Contraction frequency is regular.   Abdomen: Fetal presentation: vertex  Cervix: Cervix evaluated by digital exam.     Fetal Exam Fetal Monitor Review: Mode: ultrasound.   Baseline rate: 135.  Variability: moderate (6-25 bpm).   Pattern: accelerations present and no decelerations.    Fetal State Assessment: Category I - tracings are normal.     Physical Exam  Nursing note and vitals reviewed. Constitutional: She is oriented to person, place, and time. She appears well-developed  and well-nourished.  Neck: Normal range of motion.  Cardiovascular: Normal rate, regular rhythm and normal heart sounds.   Respiratory: Effort normal and breath sounds normal.  GI: Soft.  Musculoskeletal: Normal range of motion.  Neurological: She is alert and oriented to person, place, and time.  Skin: Skin is warm and dry.   Psychiatric: She has a normal mood and affect. Her behavior is normal. Judgment and thought content normal.    Prenatal labs: ABO, Rh: O/NEG/-- (09/15 1606) Antibody: NEG (01/08 0930) Rubella: 1.62 (09/15 1606) RPR: NON REAC (01/08 0930)  HBsAg: NEGATIVE (09/15 1606)  HIV: NONREACTIVE (01/08 0930)  GBS: Positive (03/08 0000)   Assessment/Plan: G1P0 @[redacted]w[redacted]d  with active labor at term SROM GBS positive  [facility] for GBS prophylaxis  LEFTWICH-KIRBY, Devyn Griffing 03/25/2015, 11:27 AM

## 2015-03-25 NOTE — Anesthesia Preprocedure Evaluation (Signed)
Anesthesia Evaluation  Patient identified by MRN, date of birth, ID band Patient awake    Reviewed: Allergy & Precautions, NPO status , Patient's Chart, lab work & pertinent test results  History of Anesthesia Complications Negative for: history of anesthetic complications  Airway Mallampati: II  TM Distance: >3 FB Neck ROM: Full    Dental no notable dental hx. (+) Dental Advisory Given   Pulmonary former smoker,  breath sounds clear to auscultation  Pulmonary exam normal       Cardiovascular negative cardio ROS  Rhythm:Regular Rate:Normal     Neuro/Psych negative neurological ROS  negative psych ROS   GI/Hepatic negative GI ROS, Neg liver ROS,   Endo/Other  negative endocrine ROS  Renal/GU negative Renal ROS  negative genitourinary   Musculoskeletal negative musculoskeletal ROS (+)   Abdominal   Peds negative pediatric ROS (+)  Hematology negative hematology ROS (+)   Anesthesia Other Findings   Reproductive/Obstetrics (+) Pregnancy                             Anesthesia Physical Anesthesia Plan  ASA: II  Anesthesia Plan: Epidural   Post-op Pain Management:    Induction:   Airway Management Planned:   Additional Equipment:   Intra-op Plan:   Post-operative Plan:   Informed Consent: I have reviewed the patients History and Physical, chart, labs and discussed the procedure including the risks, benefits and alternatives for the proposed anesthesia with the patient or authorized representative who has indicated his/her understanding and acceptance.   Dental advisory given  Plan Discussed with: CRNA  Anesthesia Plan Comments:         Anesthesia Quick Evaluation

## 2015-03-25 NOTE — Anesthesia Procedure Notes (Signed)
Epidural Patient location during procedure: OB Start time: 03/25/2015 1:05 PM  Staffing Anesthesiologist: Karie SchwalbeJUDD, Calah Gershman Performed by: anesthesiologist   Preanesthetic Checklist Completed: patient identified, site marked, surgical consent, pre-op evaluation, timeout performed, IV checked, risks and benefits discussed and monitors and equipment checked  Epidural Patient position: sitting Prep: site prepped and draped and DuraPrep Patient monitoring: continuous pulse ox and blood pressure Approach: midline Location: L3-L4 Injection technique: LOR saline  Needle:  Needle type: Tuohy  Needle gauge: 17 G Needle length: 9 cm and 9 Needle insertion depth: 5 cm cm Catheter type: closed end flexible Catheter size: 19 Gauge Catheter at skin depth: 10 cm Test dose: negative  Assessment Events: blood not aspirated, injection not painful, no injection resistance, negative IV test and no paresthesia  Additional Notes Patient identified. Risks/Benefits/Options discussed with patient including but not limited to bleeding, infection, nerve damage, paralysis, failed block, incomplete pain control, headache, blood pressure changes, nausea, vomiting, reactions to medication both or allergic, itching and postpartum back pain. Confirmed with bedside nurse the patient's most recent platelet count. Confirmed with patient that they are not currently taking any anticoagulation, have any bleeding history or any family history of bleeding disorders. Patient expressed understanding and wished to proceed. All questions were answered. Sterile technique was used throughout the entire procedure. Please see nursing notes for vital signs. Test dose was given through epidural catheter and negative prior to continuing to dose epidural or start infusion. Warning signs of high block given to the patient including shortness of breath, tingling/numbness in hands, complete motor block, or any concerning symptoms with instructions  to call for help. Patient was given instructions on fall risk and not to get out of bed. All questions and concerns addressed with instructions to call with any issues or inadequate analgesia.

## 2015-03-25 NOTE — Progress Notes (Signed)
Alfred Levinsshley Treadway is a 19 y.o. G1P0 at 1755w6d  Subjective: Comfortable with epidural  Objective: BP 120/84 mmHg  Pulse 91  Temp(Src) 97.6 F (36.4 C) (Oral)  Resp 16  LMP 05/26/2014      FHT:  FHR: 135 bpm, variability: moderate,  accelerations:  Abscent,  decelerations:  Absent UC:   irregular, every 3-5 minutes SVE:   Dilation: 6.5 Effacement (%): 90 Station: 0, +1 Exam by:: Dr Adrian BlackwaterStinson  Labs: Lab Results  Component Value Date   WBC 13.6* 03/25/2015   HGB 12.3 03/25/2015   HCT 36.2 03/25/2015   MCV 88.1 03/25/2015   PLT 235 03/25/2015    Assessment / Plan: Little cervical change, augment with pitocin Category 1 tracing.   Joselyne Spake JEHIEL 03/25/2015, 4:02 PM

## 2015-03-26 ENCOUNTER — Inpatient Hospital Stay (HOSPITAL_COMMUNITY): Admission: RE | Admit: 2015-03-26 | Payer: Medicaid Other | Source: Ambulatory Visit

## 2015-03-26 LAB — CBC
HCT: 29.2 % — ABNORMAL LOW (ref 36.0–46.0)
Hemoglobin: 9.7 g/dL — ABNORMAL LOW (ref 12.0–15.0)
MCH: 29.2 pg (ref 26.0–34.0)
MCHC: 33.2 g/dL (ref 30.0–36.0)
MCV: 88 fL (ref 78.0–100.0)
PLATELETS: 217 10*3/uL (ref 150–400)
RBC: 3.32 MIL/uL — AB (ref 3.87–5.11)
RDW: 14.2 % (ref 11.5–15.5)
WBC: 14.7 10*3/uL — AB (ref 4.0–10.5)

## 2015-03-26 LAB — RPR: RPR Ser Ql: NONREACTIVE

## 2015-03-26 MED ORDER — TETANUS-DIPHTH-ACELL PERTUSSIS 5-2.5-18.5 LF-MCG/0.5 IM SUSP
0.5000 mL | Freq: Once | INTRAMUSCULAR | Status: AC
  Administered 2015-03-27: 0.5 mL via INTRAMUSCULAR
  Filled 2015-03-26: qty 0.5

## 2015-03-26 MED ORDER — ACETAMINOPHEN 325 MG PO TABS
650.0000 mg | ORAL_TABLET | ORAL | Status: DC | PRN
Start: 2015-03-26 — End: 2015-03-27

## 2015-03-26 MED ORDER — ONDANSETRON HCL 4 MG PO TABS: 4.0000 mg | ORAL_TABLET | ORAL | Status: DC | PRN

## 2015-03-26 MED ORDER — BENZOCAINE-MENTHOL 20-0.5 % EX AERO
1.0000 "application " | INHALATION_SPRAY | CUTANEOUS | Status: DC | PRN
  Administered 2015-03-26: 1 via TOPICAL
  Filled 2015-03-26: qty 56

## 2015-03-26 MED ORDER — WITCH HAZEL-GLYCERIN EX PADS: 1.0000 "application " | MEDICATED_PAD | CUTANEOUS | Status: DC | PRN

## 2015-03-26 MED ORDER — DIPHENHYDRAMINE HCL 25 MG PO CAPS: 25.0000 mg | ORAL_CAPSULE | Freq: Four times a day (QID) | ORAL | Status: DC | PRN

## 2015-03-26 MED ORDER — SIMETHICONE 80 MG PO CHEW: 80.0000 mg | CHEWABLE_TABLET | ORAL | Status: DC | PRN

## 2015-03-26 MED ORDER — RHO D IMMUNE GLOBULIN 1500 UNIT/2ML IJ SOSY
300.0000 ug | PREFILLED_SYRINGE | Freq: Once | INTRAMUSCULAR | Status: AC
  Administered 2015-03-26: 300 ug via INTRAMUSCULAR
  Filled 2015-03-26: qty 2

## 2015-03-26 MED ORDER — PRENATAL MULTIVITAMIN CH
1.0000 | ORAL_TABLET | Freq: Every day | ORAL | Status: DC
  Administered 2015-03-26 – 2015-03-27 (×2): 1 via ORAL
  Filled 2015-03-26 (×2): qty 1

## 2015-03-26 MED ORDER — SENNOSIDES-DOCUSATE SODIUM 8.6-50 MG PO TABS
2.0000 | ORAL_TABLET | ORAL | Status: DC
  Administered 2015-03-26 – 2015-03-27 (×2): 2 via ORAL
  Filled 2015-03-26 (×2): qty 2

## 2015-03-26 MED ORDER — DIBUCAINE 1 % RE OINT: 1.0000 "application " | TOPICAL_OINTMENT | RECTAL | Status: DC | PRN

## 2015-03-26 MED ORDER — ONDANSETRON HCL 4 MG/2ML IJ SOLN: 4.0000 mg | INTRAMUSCULAR | Status: DC | PRN

## 2015-03-26 MED ORDER — ZOLPIDEM TARTRATE 5 MG PO TABS: 5.0000 mg | ORAL_TABLET | Freq: Every evening | ORAL | Status: DC | PRN

## 2015-03-26 MED ORDER — LANOLIN HYDROUS EX OINT: TOPICAL_OINTMENT | CUTANEOUS | Status: DC | PRN

## 2015-03-26 MED ORDER — ACYCLOVIR 400 MG PO TABS
400.0000 mg | ORAL_TABLET | Freq: Three times a day (TID) | ORAL | Status: DC
  Administered 2015-03-26 – 2015-03-27 (×5): 400 mg via ORAL
  Filled 2015-03-26 (×7): qty 1

## 2015-03-26 NOTE — Anesthesia Postprocedure Evaluation (Signed)
  Anesthesia Post-op Note  Patient: Diane Doyle  Procedure(s) Performed: * No procedures listed *  Patient Location: Mother/Baby  Anesthesia Type:Epidural  Level of Consciousness: awake, alert  and oriented  Airway and Oxygen Therapy: Patient Spontanous Breathing  Post-op Pain: none  Post-op Assessment: Post-op Vital signs reviewed and Patient's Cardiovascular Status Stable  Post-op Vital Signs: Reviewed and stable  Last Vitals:  Filed Vitals:   03/26/15 0512  BP: 118/71  Pulse: 66  Temp: 36.6 C  Resp: 20    Complications: No apparent anesthesia complications

## 2015-03-26 NOTE — Lactation Note (Signed)
This note was copied from the chart of Diane Alfred Levinsshley Postiglione. Lactation Consultation Note: Initial visit with mom. Baby is very stuffy and will take a few sucks then come off the breast to breathe. Family members in to visit. Encouraged to hold baby upright. BF brochure given with resources for support after DC. To call for assist prn. No questions at present.   Patient Name: Diane Doyle ZOXWR'UToday's Date: 03/26/2015 Reason for consult: Initial assessment   Maternal Data Formula Feeding for Exclusion: Yes Reason for exclusion: Mother's choice to formula and breast feed on admission Has patient been taught Hand Expression?: Yes  Feeding Feeding Type: Breast Fed Length of feed: 5 min  LATCH Score/Interventions Latch: Repeated attempts needed to sustain latch, nipple held in mouth throughout feeding, stimulation needed to elicit sucking reflex.  Audible Swallowing: None  Type of Nipple: Everted at rest and after stimulation  Comfort (Breast/Nipple): Soft / non-tender     Hold (Positioning): Assistance needed to correctly position infant at breast and maintain latch. Intervention(s): Breastfeeding basics reviewed  LATCH Score: 6  Lactation Tools Discussed/Used     Consult Status Consult Status: Follow-up Date: 03/26/15 Follow-up type: In-patient    Pamelia HoitWeeks, Yvon Mccord D 03/26/2015, 3:05 PM

## 2015-03-26 NOTE — Progress Notes (Signed)
Post Partum Day 1 Subjective: no complaints  Objective: Blood pressure 118/71, pulse 66, temperature 97.8 F (36.6 C), temperature source Oral, resp. rate 20, height 5\' 4"  (1.626 m), weight 72.576 kg (160 lb), last menstrual period 05/26/2014, SpO2 100 %, unknown if currently breastfeeding.  Physical Exam:  General: alert and cooperative Lochia: appropriate Uterine Fundus: firm Incision: na  DVT Evaluation: No evidence of DVT seen on physical exam.   Recent Labs  03/25/15 1521 03/26/15 0600  HGB 12.3 9.7*  HCT 36.2 29.2*    Assessment/Plan: Plan for discharge tomorrow   LOS: 1 day   Derak Schurman Grissett 03/26/2015, 9:05 AM

## 2015-03-26 NOTE — Progress Notes (Signed)
UR chart review completed.  

## 2015-03-26 NOTE — Progress Notes (Signed)
Clinical Social Work Department PSYCHOSOCIAL ASSESSMENT - MATERNAL/CHILD 03/26/2015  Patient:  Diane Doyle, Diane Doyle  Account Number:  [id number]  Admit Date:  03/25/2015  Ardine Eng Name:   Gearlean Alf   Clinical Social Worker:  Lucita Ferrara, CLINICAL SOCIAL WORKER   Date/Time:  03/26/2015 01:30 PM  Date Referred:  03/25/2015   Referral source  Central Nursery     Referred reason  Substance Abuse   Other referral source:    I:  FAMILY / HOME ENVIRONMENT Child's legal guardian:  PARENT  Guardian - Name [place] - Age Flaxton [place], [place] [id number]   Other household support members/support persons Name Relationship DOB   FATHER    BROTHER    Other support:   MOB reported that her grandmother is visiting from [place] and is her support person while at the hospital. She stated that her grandmother will help her transition home since her father is currently in [place] on a business trip.    II  PSYCHOSOCIAL DATA Information Source:  Patient Interview  Occupational hygienist Employment:   Did not Production assistant, radio resources:  Medicaid If Medicaid - County:  [place] / Grade:  N/A Music therapist / Child Services Coordination / Early Interventions:   None reported  Cultural issues impacting care:   None reported    III  STRENGTHS Strengths  Adequate Resources  Home prepared for Child (including basic supplies)  Supportive family/friends   Strength comment:    IV  RISK FACTORS AND CURRENT PROBLEMS Current Problem:  YES   Risk Factor & Current Problem Patient Issue Family Issue Risk Factor / Current Problem Comment  Substance Abuse Y N MOB with a +UDS for THC in September. Infant UDS is +THC and MDS is pending.         V  SOCIAL WORK ASSESSMENT CSW received consult and met with MOB due to history of THC use during pregnancy.  MOB had a positive UDS for THC in September.  Her subsequent UDS in  March was negative.  MOB presented as easily engaged and receptive to the visit. She displayed a full range in affect and was in a pleasant mood. MOB provided consent for her grandmother to remain in the room during the entire visit.  CSW observed the MOB to be holding and bonding with the infant during the entire visit.  She was a limited historian regarding her substance use as she denied any/all use during the pregnancy despite positive drug screens. CSW consulted with beside RN who stated that MOB has been providing appropriate infant care.   MOB expressed excitement as she becomes a mother. She shared that it continues to feel surreal, but stated that it has been anticipating and looking forward to the infant's arrival.  CSW continued to explore normative wide range of emotions that accompany the transition to motherhood as the MOB adjusts to her new "normal". MOB discussed feeling comfortable with infant care since she has a niece, and shared that she feels supported by her family and friends as she prepares to transition home. Per MOB, the home is prepared for the infant's arrival.  MOB denied mental health history, and denied symptoms during the pregnancy.  MOB presented as attentive and engaged as CSW provided education on postpartum depression.  MOB asked appropriate follow up questions about how to respond/who to talk if she notes onset of symptoms.    MOB acknowledged a "failed" drug  screen during pregnancy. She stated that she has never used THC while pregnant, and stated that she was only "around it".  CSW discussed need for direct use in order to have a +UDS, but she continued to report that she never used any THC.  CSW provided education on the hospital drug screen policy.  MOB verbalized understanding and denied concerns.  MOB was informed that the infant had a +UDS for [facility], and she continued to deny any use.  CSW discussed need to make a CPS report per hospital protocol, and she continued to  deny questions/concerns.    MOB expressed appreciation for the visit. She agreed to contact CSW if needs arise during the admission.   VI SOCIAL WORK PLAN Social Work Secretary/administrator Education  Child Protective Services Report  Information/Referral to [employer]  No Further Intervention Required / No Barriers to Discharge   Type of pt/family education:   Postpartum depression  Hospital drug screen policy   If child protective services report - county:  [place] If child protective services report - date:  03/26/2015 Information/referral to community resources comment:   [place]   Other social work plan:   CSW to monitor MDS and will notify CPS of results.    CSW to follow up PRN.    CPS may visit during admission or wait until discharged; however, there are no barriers to discharge.

## 2015-03-27 LAB — RH IG WORKUP (INCLUDES ABO/RH)
ABO/RH(D): O NEG
Fetal Screen: NEGATIVE
Gestational Age(Wks): 40
UNIT DIVISION: 0

## 2015-03-27 MED ORDER — IBUPROFEN 600 MG PO TABS: 600.0000 mg | ORAL_TABLET | Freq: Four times a day (QID) | ORAL | Status: AC | PRN

## 2015-03-27 NOTE — Discharge Summary (Signed)
Obstetric Discharge Summary Reason for Admission: onset of labor Prenatal Procedures: ultrasound Intrapartum Procedures: spontaneous vaginal delivery and manual extraction of placenta Postpartum Procedures: Rho(D) Ig Complications-Operative and Postpartum: 3rd degree perineal laceration   Delivery Summary: At 9:15 PM a viable and healthy female was delivered via Vaginal, Spontaneous Delivery (Presentation: ; Occiput Anterior). APGAR: 9, 9;  Placenta status: Intact, Manual removal. Cord: 3 vessels with the following complications: None.   Anesthesia: Epidural  Episiotomy: None Lacerations: 3rd degree Suture Repair: 3.0 monocryl Est. Blood Loss (mL): 800mL  Mom to postpartum. Baby to Couplet care / Skin to Skin. Placenta to pathology.  Viable female infant delivered with partial third degree laceration. Cord was partially avulsed with traction and no further traction was applied after that. The placenta partially detached approximately 20 minutes after delivery but could not be delivered the rest of the way. Dr. Adrian BlackwaterStinson was called to assist and performed manual extraction of the placenta which appeared intact. He swept the uterus and did not encounter any retained products/membranes. He then repaired the perineal laceration in the usual manner.  Diane Doyle 03/25/2015, 10:18 PM    Hospital Course:  Active Problems:   Active labor   Diane Doyle is a 19 y.o. G1P1001 s/p spontaneous vaginal delivery with manual extraction of placenta.  Patient was admitted 03/24/2014.Marland Kitchen.  She has postpartum course that was uncomplicated including no problems with ambulating, PO intake, urination, pain, or bleeding. The pt feels ready to go home and  will be discharged with outpatient follow-up.    Today: No acute events overnight.  Pt denies problems with ambulating, voiding or po intake.  She denies nausea or vomiting.  Pain is well controlled.  She has had flatus. She has not had bowel movement.   Lochia Minimal.  Plan for birth control is  Nexplanon.  Method of Feeding: Breast and Bottle  Physical Exam:  General: alert, cooperative and no distress  Cardiovascular: Regular rate and rhythm without murmur. Lungs: Clear to auscultation bilaterally without rales or wheezing. Lochia: appropriate Uterine Fundus: Firm and Midline DVT Evaluation: No evidence of DVT seen on physical exam. No cords or calf tenderness. No significant calf/ankle edema.  H/H: Lab Results  Component Value Date/Time   HGB 9.7* 03/26/2015 06:00 AM   HCT 29.2* 03/26/2015 06:00 AM    Discharge Diagnoses: Term Pregnancy-delivered  Discharge Information: Date: 03/27/2015 Activity: pelvic rest Diet: routine  Medications: PNV and Ibuprofen Breast feeding:  Yes Condition: stable Instructions: refer to handout Discharge to: home   Medication List    STOP taking these medications        acyclovir 400 MG tablet  Commonly known as:  ZOVIRAX      TAKE these medications        ibuprofen 600 MG tablet  Commonly known as:  ADVIL,MOTRIN  Take 1 tablet (600 mg total) by mouth every 6 (six) hours as needed.     multivitamin-prenatal 27-0.8 MG Tabs tablet  Take 1 tablet by mouth daily at 12 noon.           Diane Doyle ,PA-Student 03/27/2015,7:18 AM   CNM attestation I have seen and examined this patient and agree with above documentation in the medical student's note.   Diane Doyle is a 19 y.o. G1P1001 s/p SVD.   Pain is well controlled.  Plan for birth control is Nexplanon.  Method of Feeding: br/bo  PE:  BP 124/82 mmHg  Pulse 76  Temp(Src) 97.6 F (36.4 C) (Oral)  Resp  18  Ht  (1.626 m)  Wt 72.576 kg (160 lb)  BMI 27.45 kg/m2  SpO2 100%  LMP 05/26/2014  Breastfeeding? Unknown Heart: RRR Lungs: nl effort Fundus firm Ext: tr edema, no s/s DVT    Recent Labs  03/25/15 1521 03/26/15 0600  HGB 12.3 9.7*  HCT 36.2 29.2*   CBC Latest Ref Rng 03/26/2015 03/25/2015 03/25/2015   WBC 4.0 - 10.5 K/uL 14.7(H) 13.6(H) 11.1(H)  Hemoglobin 12.0 - 15.0 g/dL 1.6(X) 09.6 04.5  Hematocrit 36.0 - 46.0 % 29.2(L) 36.2 38.9  Platelets 150 - 400 K/uL 217 235 250      Plan: discharge today - postpartum care discussed - f/u clinic in 6 weeks for postpartum visit   SHAW, KIMBERLY, CNM 9:01 AM

## 2015-03-27 NOTE — Lactation Note (Signed)
This note was copied from the chart of Diane Alfred Levinsshley Bonet. Lactation Consultation Note Baby had 7% weight loss. RN stated baby really hasn't BF that well since birth. Having respiratory issues. Snorty, nasal congestion, spitty, gagging, spitting thick throuthy mucous.  Moms breast are filling w/colostrum. Hand expression taught and expressed 7 ml colostrum.  Latched baby to breast, having hard time breathing through nose, loud respirations. Assisted in football hold so head would be elevated, held breast in t-cup position so everted nipple would be easier latch, if I let go, baby let go.  Fitted mom w/#16NS to Lt. Nipple, baby latched well, popping off and on d/t breathing issues. Nares w/edema and bridge of nose appear to have edema.  Baby spitting thick clear mucous. Labored breathing. RN to to nursery to check O2 stats. 94-95% O2. W/gloved finger rubbed gums w/colostrum trying to stimulate sucking. Baby would suck occasionally. Got 5 ml colostrum in baby w/syring and glove tip finger.  Discussed w/RN plan of care and MD needing to assess.   Looks jaundice. Poor I&O today  Patient Name: Diane Doyle'UToday's Date: 03/27/2015 Reason for consult: Follow-up assessment;Difficult latch;Infant weight loss   Maternal Data    Feeding Feeding Type: Breast Fed Length of feed: 3 min  LATCH Score/Interventions Latch: Repeated attempts needed to sustain latch, nipple held in mouth throughout feeding, stimulation needed to elicit sucking reflex. Intervention(s): Skin to skin;Waking techniques;Teach feeding cues Intervention(s): Adjust position;Assist with latch;Breast compression;Breast massage  Audible Swallowing: None Intervention(s): Skin to skin;Hand expression  Type of Nipple: Everted at rest and after stimulation  Comfort (Breast/Nipple): Soft / non-tender     Hold (Positioning): Assistance needed to correctly position infant at breast and maintain latch. Intervention(s):  Breastfeeding basics reviewed;Support Pillows;Position options;Skin to skin  LATCH Score: 6  Lactation Tools Discussed/Used Tools: Nipple Shields Nipple shield size: 16;20 Pump Review: Setup, frequency, and cleaning;Milk Storage Initiated by:: RN Date initiated:: 03/26/15   Consult Status Consult Status: Follow-up Date: 03/27/15 Follow-up type: In-patient    Charyl DancerCARVER, Kaelon Weekes G 03/27/2015, 6:13 AM

## 2015-03-27 NOTE — Discharge Instructions (Signed)

## 2015-03-27 NOTE — Lactation Note (Signed)
This note was copied from the chart of Diane Doyle Tino. Lactation Consultation Note: Follow up visit with mom. She reports that baby is still gagging and spitting up but did have a 10 min feeding this morning. Attempted to latch baby but she is still gagging and spitting up mucous. Very fussy then goes off to sleep when mucous came up. DEBP set up for mom with instructions. Mom pumped for 10 min- few drops obtained.Hand expression- spoon fed to baby. Baby fussy again-rooting, latched and nursed for 5 min then off to sleep. No questions at present. Encouraged to call for assist prn.   Patient Name: Diane Doyle Obarr ZOXWR'UToday's Date: 03/27/2015 Reason for consult: Follow-up assessment   Maternal Data    Feeding Feeding Type: Breast Fed Length of feed: 5 min  LATCH Score/Interventions Latch: Repeated attempts needed to sustain latch, nipple held in mouth throughout feeding, stimulation needed to elicit sucking reflex.  Audible Swallowing: None  Type of Nipple: Everted at rest and after stimulation  Comfort (Breast/Nipple): Soft / non-tender     Hold (Positioning): Assistance needed to correctly position infant at breast and maintain latch.  LATCH Score: 6  Lactation Tools Discussed/Used     Consult Status Consult Status: Follow-up Date: 03/28/15 Follow-up type: In-patient    Pamelia HoitWeeks, Lakrista Scaduto D 03/27/2015, 2:49 PM

## 2015-03-29 LAB — TYPE AND SCREEN
ABO/RH(D): O NEG
Antibody Screen: POSITIVE
DAT, IgG: NEGATIVE
UNIT DIVISION: 0
Unit division: 0

## 2015-04-29 ENCOUNTER — Ambulatory Visit: Payer: BLUE CROSS/BLUE SHIELD | Admitting: Obstetrics & Gynecology

## 2015-04-29 ENCOUNTER — Encounter: Payer: Self-pay | Admitting: Obstetrics & Gynecology

## 2015-06-10 ENCOUNTER — Encounter: Payer: Self-pay | Admitting: *Deleted

## 2015-07-15 IMAGING — US US OB COMP +14 WK
1 series · 9 of 9 positions shown · non-contrast
Comparison: none

[Series 1: us ob comp +14 wk · 0.25mm/px · 9 of 9 slices shown]
[im 1/9]
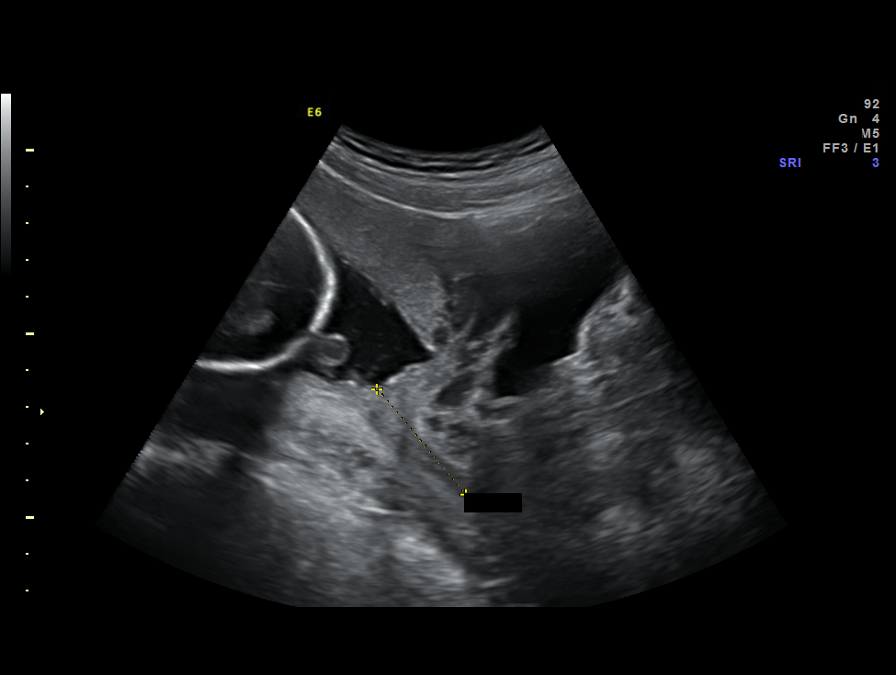
[im 2/9]
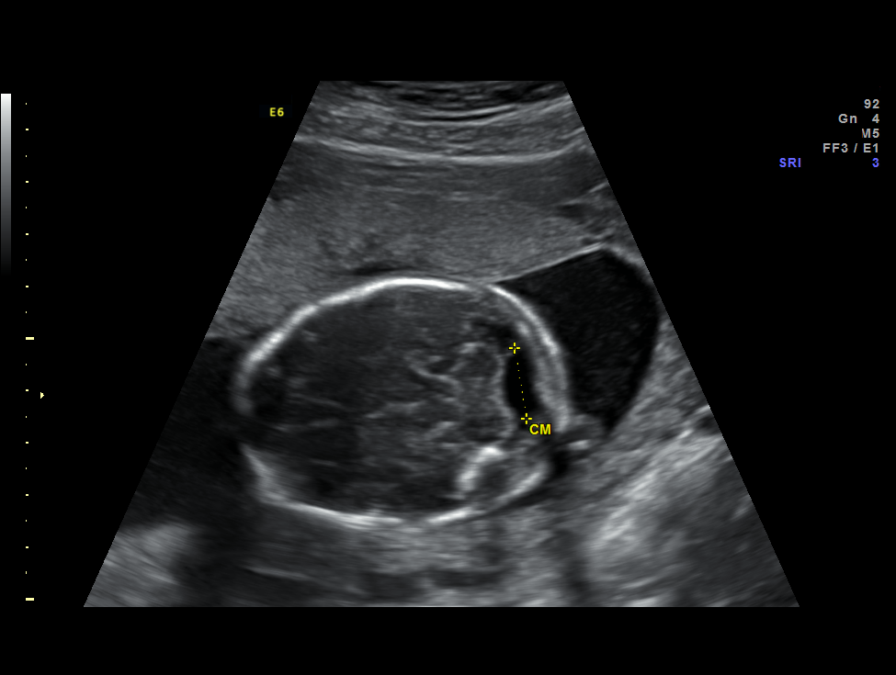
[im 3/9]
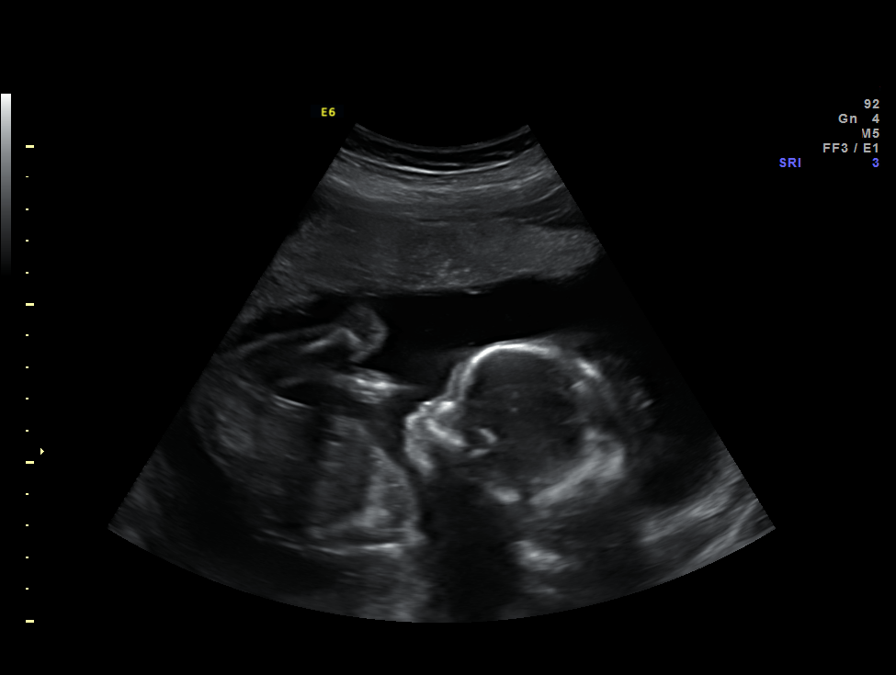
[im 4/9]
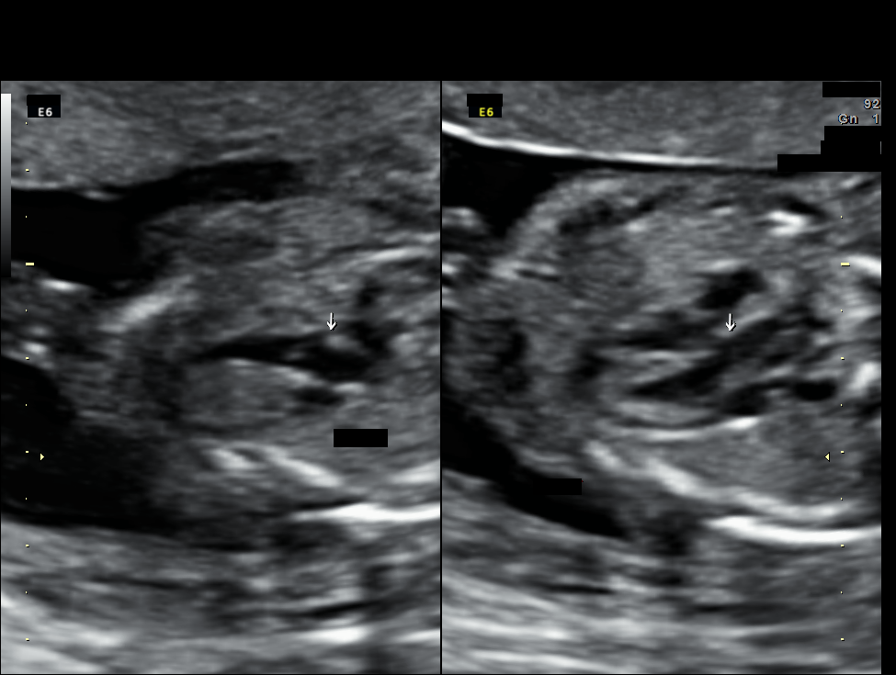
[im 5/9]
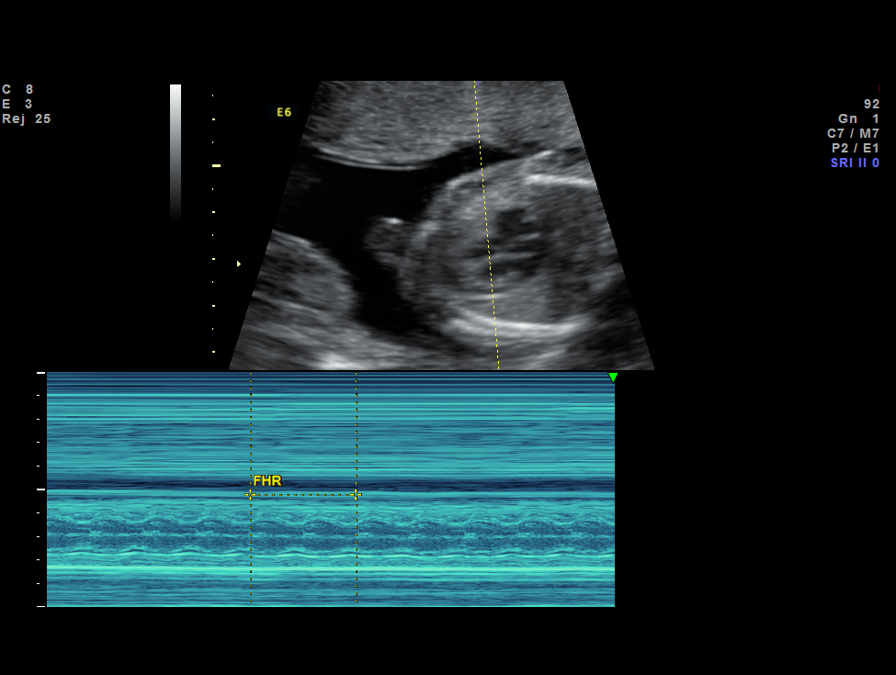
[im 6/9]
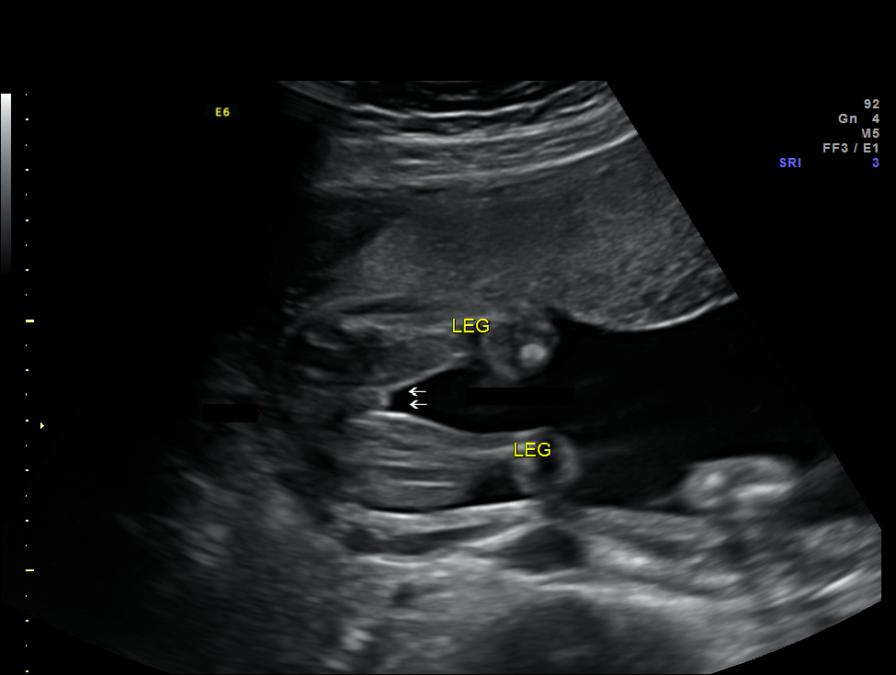
[im 7/9]
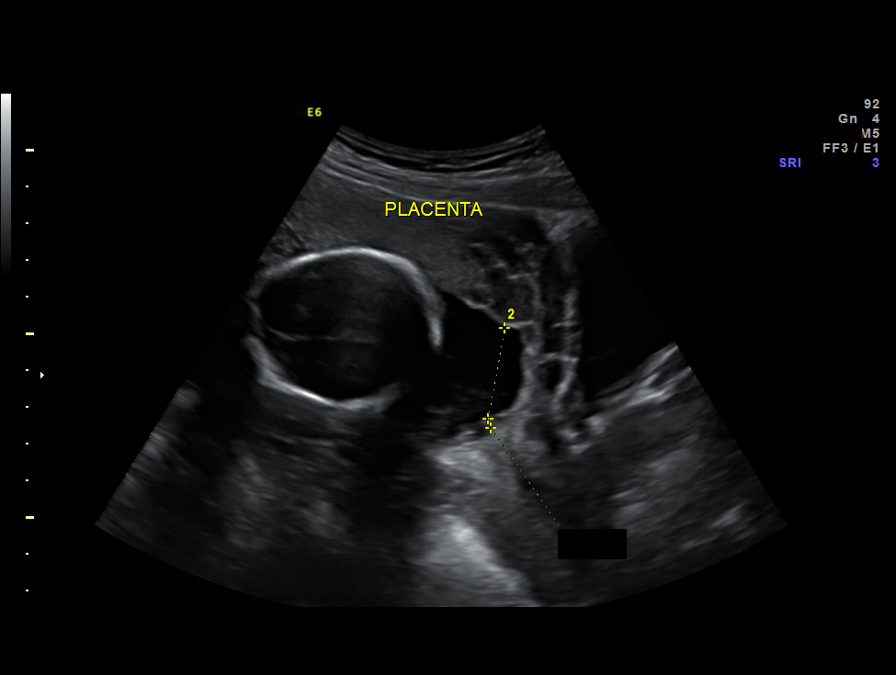
[im 8/9]
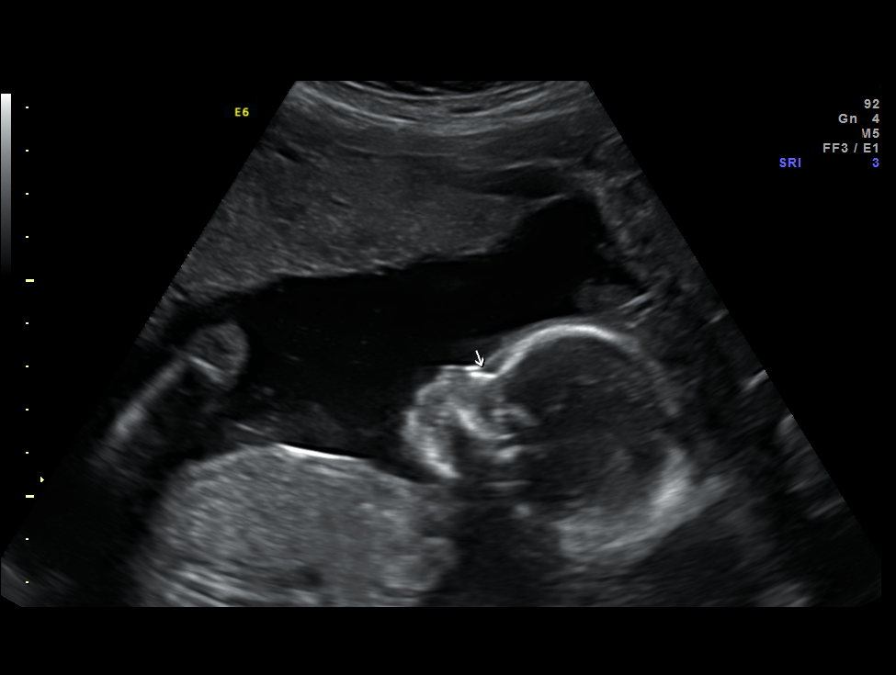
[im 9/9]
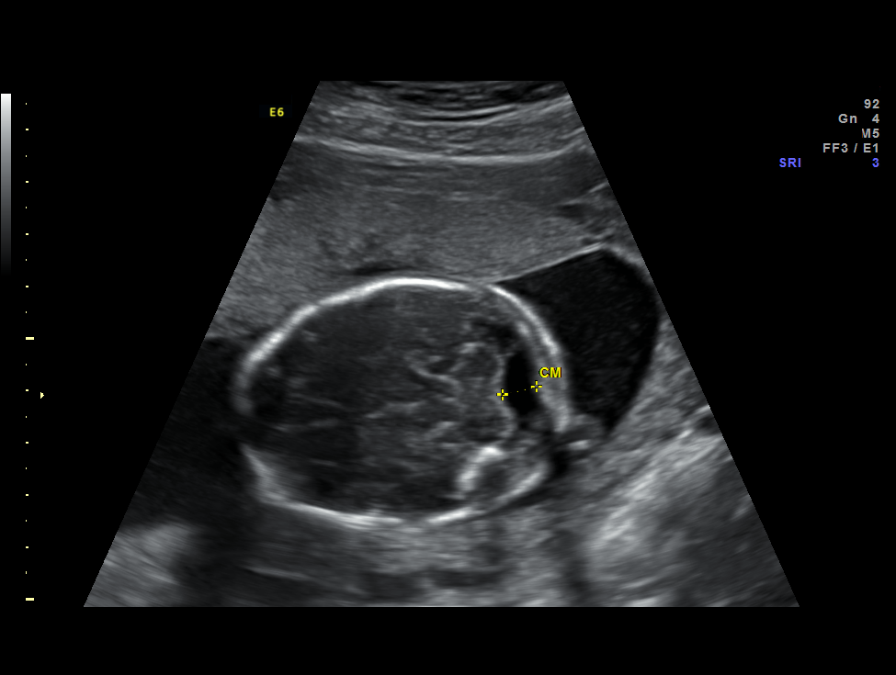

[9 of 9 positions shown; findings below may reference images not displayed]

Canned report from images found in remote index.

Refer to host system for actual result text.
